# Patient Record
Sex: Female | Born: 1970 | Race: Black or African American | Hispanic: No | Marital: Single | Smoking: Light tobacco smoker
Health system: Southern US, Community
[De-identification: ages and names within clinical notes are randomized; demographics above are authoritative.]

## PROBLEM LIST (undated history)

## (undated) DIAGNOSIS — N200 Calculus of kidney: Secondary | ICD-10-CM

## (undated) DIAGNOSIS — N12 Tubulo-interstitial nephritis, not specified as acute or chronic: Secondary | ICD-10-CM

---

## 1998-02-01 ENCOUNTER — Inpatient Hospital Stay (HOSPITAL_COMMUNITY): Admission: AD | Admit: 1998-02-01 | Discharge: 1998-02-02 | Payer: Self-pay | Admitting: Obstetrics & Gynecology

## 1998-08-19 ENCOUNTER — Inpatient Hospital Stay (HOSPITAL_COMMUNITY): Admission: EM | Admit: 1998-08-19 | Discharge: 1998-08-19 | Payer: Self-pay | Admitting: *Deleted

## 1998-08-20 ENCOUNTER — Inpatient Hospital Stay (HOSPITAL_COMMUNITY): Admission: AD | Admit: 1998-08-20 | Discharge: 1998-08-20 | Payer: Self-pay | Admitting: *Deleted

## 1998-09-11 ENCOUNTER — Inpatient Hospital Stay (HOSPITAL_COMMUNITY): Admission: AD | Admit: 1998-09-11 | Discharge: 1998-09-11 | Payer: Self-pay | Admitting: *Deleted

## 1998-11-01 ENCOUNTER — Encounter: Payer: Self-pay | Admitting: Emergency Medicine

## 1998-11-01 ENCOUNTER — Emergency Department (HOSPITAL_COMMUNITY): Admission: EM | Admit: 1998-11-01 | Discharge: 1998-11-01 | Payer: Self-pay | Admitting: Emergency Medicine

## 1998-12-08 ENCOUNTER — Emergency Department (HOSPITAL_COMMUNITY): Admission: EM | Admit: 1998-12-08 | Discharge: 1998-12-08 | Payer: Self-pay | Admitting: Emergency Medicine

## 2000-04-27 ENCOUNTER — Inpatient Hospital Stay (HOSPITAL_COMMUNITY): Admission: AD | Admit: 2000-04-27 | Discharge: 2000-05-01 | Payer: Self-pay | Admitting: *Deleted

## 2000-04-27 ENCOUNTER — Encounter: Payer: Self-pay | Admitting: *Deleted

## 2000-11-20 ENCOUNTER — Emergency Department (HOSPITAL_COMMUNITY): Admission: EM | Admit: 2000-11-20 | Discharge: 2000-11-21 | Payer: Self-pay | Admitting: Emergency Medicine

## 2000-11-28 ENCOUNTER — Inpatient Hospital Stay (HOSPITAL_COMMUNITY): Admission: EM | Admit: 2000-11-28 | Discharge: 2000-11-30 | Payer: Self-pay | Admitting: *Deleted

## 2012-11-24 ENCOUNTER — Observation Stay (HOSPITAL_COMMUNITY): Payer: Medicaid Other

## 2012-11-24 ENCOUNTER — Observation Stay (HOSPITAL_COMMUNITY)
Admission: EM | Admit: 2012-11-24 | Discharge: 2012-11-25 | Disposition: A | Discharge status: Home / Self Care | Payer: Medicaid Other | Attending: Internal Medicine | Admitting: Internal Medicine

## 2012-11-24 ENCOUNTER — Encounter (HOSPITAL_COMMUNITY): Payer: Self-pay | Admitting: Emergency Medicine

## 2012-11-24 DIAGNOSIS — I959 Hypotension, unspecified: Secondary | ICD-10-CM | POA: Insufficient documentation

## 2012-11-24 DIAGNOSIS — N10 Acute tubulo-interstitial nephritis: Principal | ICD-10-CM | POA: Insufficient documentation

## 2012-11-24 DIAGNOSIS — N12 Tubulo-interstitial nephritis, not specified as acute or chronic: Secondary | ICD-10-CM

## 2012-11-24 HISTORY — DX: Tubulo-interstitial nephritis, not specified as acute or chronic: N12

## 2012-11-24 LAB — BASIC METABOLIC PANEL
BUN: 10 mg/dL (ref 6–23)
CO2: 24 mEq/L (ref 19–32)
Chloride: 103 mEq/L (ref 96–112)
GFR calc non Af Amer: 67 mL/min — ABNORMAL LOW (ref >90)
Glucose, Bld: 97 mg/dL (ref 70–99)
Potassium: 3.7 mEq/L (ref 3.5–5.1)
Sodium: 136 mEq/L (ref 135–145)

## 2012-11-24 LAB — CBC WITH DIFFERENTIAL/PLATELET
Eosinophils Absolute: 0.1 10*3/uL (ref 0.0–0.7)
HCT: 34 % — ABNORMAL LOW (ref 36.0–46.0)
Lymphocytes Relative: 34 % (ref 12–46)
Lymphs Abs: 2.5 10*3/uL (ref 0.7–4.0)
MCHC: 35.9 g/dL (ref 30.0–36.0)
MCV: 81.3 fL (ref 78.0–100.0)
Monocytes Relative: 10 % (ref 3–12)
Neutro Abs: 4.2 10*3/uL (ref 1.7–7.7)
RDW: 13.5 % (ref 11.5–15.5)

## 2012-11-24 LAB — CG4 I-STAT (LACTIC ACID): Lactic Acid, Venous: 0.58 mmol/L (ref 0.5–2.2)

## 2012-11-24 LAB — URINE MICROSCOPIC-ADD ON

## 2012-11-24 LAB — HEPATIC FUNCTION PANEL
AST: 19 U/L (ref 0–37)
Albumin: 3.3 g/dL — ABNORMAL LOW (ref 3.5–5.2)
Alkaline Phosphatase: 54 U/L (ref 39–117)
Total Bilirubin: 0.5 mg/dL (ref 0.3–1.2)
Total Protein: 7.2 g/dL (ref 6.0–8.3)

## 2012-11-24 LAB — URINALYSIS, ROUTINE W REFLEX MICROSCOPIC
Glucose, UA: NEGATIVE mg/dL
Ketones, ur: NEGATIVE mg/dL
Protein, ur: 30 mg/dL — AB

## 2012-11-24 MED ORDER — PROMETHAZINE HCL 25 MG PO TABS: 25.0000 mg | ORAL_TABLET | Freq: Four times a day (QID) | ORAL | Status: DC | PRN

## 2012-11-24 MED ORDER — ONDANSETRON HCL 4 MG/2ML IJ SOLN: 4.0000 mg | Freq: Four times a day (QID) | INTRAMUSCULAR | Status: DC | PRN

## 2012-11-24 MED ORDER — HYDROCODONE-ACETAMINOPHEN 5-325 MG PO TABS: ORAL_TABLET | ORAL | Status: DC

## 2012-11-24 MED ORDER — DEXTROSE 5 % IV SOLN
1.0000 g | Freq: Once | INTRAVENOUS | Status: AC
  Administered 2012-11-24: 1 g via INTRAVENOUS
  Filled 2012-11-24: qty 10

## 2012-11-24 MED ORDER — KETOROLAC TROMETHAMINE 30 MG/ML IJ SOLN
15.0000 mg | Freq: Once | INTRAMUSCULAR | Status: AC
  Administered 2012-11-24: 15 mg via INTRAVENOUS
  Filled 2012-11-24: qty 1

## 2012-11-24 MED ORDER — HYDROCODONE-ACETAMINOPHEN 5-325 MG PO TABS
1.0000 | ORAL_TABLET | ORAL | Status: DC | PRN
  Administered 2012-11-24 – 2012-11-25 (×6): 2 via ORAL
  Filled 2012-11-24 (×6): qty 2

## 2012-11-24 MED ORDER — ENOXAPARIN SODIUM 40 MG/0.4ML ~~LOC~~ SOLN
40.0000 mg | SUBCUTANEOUS | Status: DC
  Administered 2012-11-24: 40 mg via SUBCUTANEOUS
  Filled 2012-11-24 (×2): qty 0.4

## 2012-11-24 MED ORDER — SODIUM CHLORIDE 0.9 % IV SOLN
INTRAVENOUS | Status: DC
  Administered 2012-11-24 – 2012-11-25 (×2): via INTRAVENOUS

## 2012-11-24 MED ORDER — DEXTROSE 5 % IV SOLN
1.0000 g | INTRAVENOUS | Status: DC
  Administered 2012-11-25: 1 g via INTRAVENOUS
  Filled 2012-11-24: qty 10

## 2012-11-24 MED ORDER — ONDANSETRON HCL 4 MG PO TABS
4.0000 mg | ORAL_TABLET | Freq: Four times a day (QID) | ORAL | Status: DC | PRN
  Administered 2012-11-25: 4 mg via ORAL
  Filled 2012-11-24: qty 1

## 2012-11-24 MED ORDER — MORPHINE SULFATE 4 MG/ML IJ SOLN
4.0000 mg | Freq: Once | INTRAMUSCULAR | Status: AC
  Administered 2012-11-24: 4 mg via INTRAVENOUS
  Filled 2012-11-24: qty 1

## 2012-11-24 MED ORDER — SODIUM CHLORIDE 0.9 % IV BOLUS (SEPSIS)
1000.0000 mL | Freq: Once | INTRAVENOUS | Status: AC
  Administered 2012-11-24: 1000 mL via INTRAVENOUS

## 2012-11-24 MED ORDER — SODIUM CHLORIDE 0.9 % IJ SOLN: 3.0000 mL | Freq: Two times a day (BID) | INTRAMUSCULAR | Status: DC

## 2012-11-24 MED ORDER — ACETAMINOPHEN 325 MG PO TABS: 650.0000 mg | ORAL_TABLET | Freq: Four times a day (QID) | ORAL | Status: DC | PRN

## 2012-11-24 MED ORDER — ONDANSETRON HCL 4 MG/2ML IJ SOLN
4.0000 mg | Freq: Once | INTRAMUSCULAR | Status: AC
  Administered 2012-11-24: 4 mg via INTRAVENOUS
  Filled 2012-11-24: qty 2

## 2012-11-24 MED ORDER — ACETAMINOPHEN 650 MG RE SUPP: 650.0000 mg | Freq: Four times a day (QID) | RECTAL | Status: DC | PRN

## 2012-11-24 MED ORDER — CEPHALEXIN 500 MG PO CAPS: 500.0000 mg | ORAL_CAPSULE | Freq: Three times a day (TID) | ORAL | Status: DC

## 2012-11-24 NOTE — ED Notes (Signed)
MD at bedside. 

## 2012-11-24 NOTE — ED Provider Notes (Signed)
I was notified by nursing staff that just prior to discharge patient had an episode of hypotension. Her systolic pressures were measured in the 80s. IV fluid bolus was given. Lactate ordered and pending.  I assessed the patient she was awake, alert and oriented with no new complaints.   11:04 AM Pt reassessed. Still mild hypotension despite IVF bolus x 3.  Lactate looks ok.  Will plan for admission for persistent hypotension in setting of pyelonephritis.  Will order blood cultures.  Ashby Dawes, MD 11/24/12 [phone]

## 2012-11-24 NOTE — ED Notes (Signed)
Admission MD at bedside.  

## 2012-11-24 NOTE — Progress Notes (Signed)
MD on call notified that pt had a run of Junctional rhythm but pt had converted to NSR MD was on on the floor and and strip was shown. Pt was asymptomatic  No new orders. Ilean Skill LPN

## 2012-11-24 NOTE — ED Notes (Signed)
Report given to Diane, RN.

## 2012-11-24 NOTE — Care Management Note (Signed)
    Page 1 of 1   11/25/2012     12:45:15 PM   CARE MANAGEMENT NOTE 11/25/2012  Patient:  Melody Henderson, Melody Henderson   Account Number:  [id number]  Date Initiated:  11/24/2012  Documentation initiated by:  Letha Cape  Subjective/Objective Assessment:   dx pyelo  admit- lives with spouse, just moved from [place], will need to apply for medicaid of [place]. pta indep.     Action/Plan:   Anticipated DC Date:  11/25/2012   Anticipated DC Plan:  HOME/SELF CARE      DC Planning Services  CM consult      Choice offered to / List presented to:             Status of service:  Completed, signed off Medicare Important Message given?   (If response is "NO", the following Medicare IM given date fields will be blank) Date Medicare IM given:   Date Additional Medicare IM given:    Discharge Disposition:  HOME/SELF CARE  Per UR Regulation:  Reviewed for med. necessity/level of care/duration of stay  If discussed at Long Length of Stay Meetings, dates discussed:    Comments:  11/25/12 12:43 Letha Cape RN, BSN [phone] patient is for dc today, MD will give patient meds off the $4 list at [facility], so she will not need medication ast. No needs anticipated. Patient will f/u with internal medicine clinic.  11/24/12 15:41 Letha Cape RN, BSN [phone] patient lives with spouse, she just moved to [place] from [place] she had medicaid there but will now need to apply for Medicaid of [place].  Patient will need a PCP, but will need to have been in [place] for 3 months  to get appt at [facility].  Spoke with Dr. Darci Needle and she states she will have patient follow up with them in the Internal Medicine Clinic and they will try to use $4 med list for patient .  Also the Financial counselor will be contacting patient in the room.

## 2012-11-24 NOTE — Progress Notes (Signed)
Melody Henderson [id number] Admission Data: 11/24/2012 4:58 PM Attending Provider: Rocco Serene, MD  PCP:No PCP Per Patient Consults/ Treatment Team:    Melody Henderson is a 42 y.o. female patient admitted from ED awake, alert  & orientated  X 3,  Full Code, VSS - Blood pressure 95/60, pulse 82, temperature 98.3 F (36.8 C), temperature source Oral, resp. rate 20, height 5\' 6"  (1.676 m), weight 73.573 kg (162 lb 3.2 oz), last menstrual period 11/21/2012, SpO2 98.00%., , no c/o shortness of breath, no c/o chest pain, no distress noted. Tele # [id number] placed and pt is currently running:normal sinus rhythm.   IV site WDL:  antecubital right, condition patent and no redness with a transparent dsg that's clean dry and intact.  Allergies:  No Known Allergies   Past Medical History  Diagnosis Date  . Pyelonephritis 11/24/2012    History:  obtained from the patient.   Pt orientation to unit, room and routine. Information packet given to patient/family and safety video watched.  Admission INP armband ID verified with patient/family, and in place. SR up x 2, fall risk assessment complete with Patient and family verbalizing understanding of risks associated with falls. Pt verbalizes an understanding of how to use the call bell and to call for help before getting out of bed.  Skin, clean-dry- intact without evidence of bruising, or skin tears.   No evidence of skin break down noted on exam.     Will cont to monitor and assist as needed.  Cindra Eves, RN 11/24/2012 4:58 PM

## 2012-11-24 NOTE — ED Notes (Signed)
Lactic acid results shown to Dr. Dan Humphreys

## 2012-11-24 NOTE — ED Notes (Signed)
MD Dan Humphreys made aware of pt's BP.

## 2012-11-24 NOTE — ED Notes (Signed)
Per pt, pt has been experiencing generalized pain since Monday. Pt states it has gotten worse since then. When asked to be more specific about her pain, pt stated that her kidneys hurt and sometimes it felt like it was hard to breathe. Pt also stated having chills and nausea. Denies Emesis.

## 2012-11-24 NOTE — ED Notes (Signed)
Reported low bp to berkely/rn.

## 2012-11-24 NOTE — ED Provider Notes (Signed)
History    CSN: [id number] Arrival date & time 11/24/12  0603  First MD Initiated Contact with Patient 11/24/12 [phone]     Chief Complaint  Patient presents with  . Fatigue  . Chills  . Pain   (Consider location/radiation/quality/duration/timing/severity/associated sxs/prior Treatment) HPI  Melody Henderson is a 42 y.o. female complaining of subjective fever, chills, flank pain, nausea urinary frequency worsening over the course of the last 4 days. Patient denies dysuria, emesis, cough, shortness of breath, headache, cervicalgia, rash, change in bowel habits, sick contacts, abnormal vaginal discharge. She rates her pain at 9/10, she is taken Motrin at home with little relief, she cannot identify any alleviating factors, she states that taking a deep breath makes the pain worse.  History reviewed. No pertinent past medical history. History reviewed. No pertinent past surgical history. No family history on file. History  Substance Use Topics  . Smoking status: Not on file  . Smokeless tobacco: Not on file  . Alcohol Use: Not on file   OB History   Grav Para Term Preterm Abortions TAB SAB Ect Mult Living                 Review of Systems  Constitutional:       Negative except as described in HPI  HENT:       Negative except as described in HPI  Respiratory:       Negative except as described in HPI  Cardiovascular:       Negative except as described in HPI  Gastrointestinal:       Negative except as described in HPI  Genitourinary:       Negative except as described in HPI  Musculoskeletal:       Negative except as described in HPI  Skin:       Negative except as described in HPI  Neurological:       Negative except as described in HPI  All other systems reviewed and are negative.    Allergies  Review of patient's allergies indicates not on file.  Home Medications  No current outpatient prescriptions on file. LMP 11/21/2012 Physical Exam  Nursing note and  vitals reviewed. Constitutional: She is oriented to person, place, and time. She appears well-developed and well-nourished. No distress.  Appears mildly uncomfortable  HENT:  Head: Normocephalic and atraumatic.  Mouth/Throat: Oropharynx is clear and moist.  Eyes: Conjunctivae and EOM are normal.  Neck: Normal range of motion. Neck supple.  No midline tenderness to palpation or step-offs appreciated. Patient has full range of motion without pain.   Cardiovascular: Normal rate.   Pulmonary/Chest: Effort normal and breath sounds normal. No stridor. No respiratory distress. She has no wheezes. She has no rales. She exhibits no tenderness.  Abdominal: Soft. Bowel sounds are normal. She exhibits no distension and no mass. There is no tenderness. There is no rebound and no guarding.  Genitourinary:  Mild to moderate bilateral CVA tenderness  Musculoskeletal: Normal range of motion. She exhibits no edema.  No calf asymmetry, superficial collaterals, palpable cords, edema, Homans sign negative bilaterally.    Neurological: She is alert and oriented to person, place, and time.  Psychiatric: She has a normal mood and affect.    ED Course  Procedures (including critical care time) Labs Reviewed  CBC WITH DIFFERENTIAL - Abnormal; Notable for the following:    HCT 34.0 (*)    All other components within normal limits  BASIC METABOLIC PANEL - Abnormal; Notable for  the following:    GFR calc non Af Amer 67 (*)    GFR calc Af Amer 77 (*)    All other components within normal limits  URINALYSIS, ROUTINE W REFLEX MICROSCOPIC  POCT PREGNANCY, URINE   No results found.  8:47 AM patient states her pain is improved however she does still have a little pain in the back. I will put her in for Toradol in addition to morphine.  1. Pyelonephritis   2. Acute pyelonephritis     MDM   Filed Vitals:   11/24/12 0632 11/24/12 0810  BP: 98/58 104/66  Pulse: 80 74  Temp: 98.7 F (37.1 C)   TempSrc:  Oral   Resp: 18 18  SpO2: 98% 98%     Melody Henderson is a 42 y.o. female with history of present illness consistent with pyelonephritis, patient also has bilateral CVA tenderness. Patient has no leukocytosis. UA c/w UTI/Pyelo  Medications  sodium chloride 0.9 % bolus 1,000 mL (1,000 mLs Intravenous New Bag/Given 11/24/12 4098)  morphine 4 MG/ML injection 4 mg (4 mg Intravenous Given 11/24/12 0711)  ondansetron (ZOFRAN) injection 4 mg (4 mg Intravenous Given 11/24/12 0711)    Pt is hemodynamically stable, appropriate for, and amenable to discharge at this time. Pt verbalized understanding and agrees with care plan. All questions answered. Outpatient follow-up and specific return precautions discussed.    Discharge Medication List as of 11/25/2012  2:37 PM    START taking these medications   Details  ciprofloxacin (CIPRO) 500 MG tablet Take 1 tablet (500 mg total) by mouth 2 (two) times daily., Starting 11/25/2012, Until Discontinued, Print    HYDROcodone-acetaminophen (NORCO/VICODIN) 5-325 MG per tablet Take 1-2 tablets by mouth every 6 hours as needed for pain., Print    promethazine (PHENERGAN) 25 MG tablet Take 1 tablet (25 mg total) by mouth every 6 (six) hours as needed for nausea., Starting 11/24/2012, Until Discontinued, Print        Note: Portions of this report may have been transcribed using voice recognition software. Every effort was made to ensure accuracy; however, inadvertent computerized transcription errors may be present    Wynetta Emery, PA-C 11/26/12 [phone]

## 2012-11-24 NOTE — H&P (Signed)
Date: 11/24/2012               Patient Name:  Melody Henderson MRN: [id number]  DOB: Sep 14, 1970 Age / Sex: 42 y.o., female   PCP: No Pcp Per Patient         Medical Service: Internal Medicine Teaching Service         Attending Physician: Dr. Rocco Serene, MD    First Contact: Dr. Darci Needle Pager: [phone]  Second Contact: Dr. Manson Passey Pager: [phone]       After Hours (After 5p/  First Contact Pager: [phone]  weekends / holidays): Second Contact Pager: [phone]   Chief Complaint: flank pain, dysuria  History of Present Illness:  The patient is a 41 YO woman with no significant PMH who is presenting with flank pain, dysuria, nausea. She states that last Friday she started having some mild dysuria which progressed into "kidney" pain which is located in her lower back and radiates into her lower abdomen. She states that she was taking some motrin (ibuprofen) over the counter which did not help much. She has also been feeling nauseous for about 4 days and has decreased the amount she is drinking and eating but she denied vomiting, constipation, diarrhea. She states that she has a headache but this is not unusual for her. She was having fevers and chills for a day or two and was feeling like she would "fall out" the day prior to admission. She denies falling or passing out. She states that she has had this problem before when she was living in [place]. She moved last week with her husband. She has 9 children ranging in age from 47-10 and not all of them are still living with her. She does not admit to using alcohol or tobacco or illicit substances. She is monogamous with her husband and has not had any new sexual partners recently. She denies any vaginal discharge. She does not have a doctor in the area.   Meds: OTC Motrin  Allergies: Allergies as of 11/24/2012  . (No Known Allergies)   History reviewed. No pertinent past medical history. History reviewed. No pertinent past surgical  history. History reviewed. No pertinent family history. History   Social History  . Marital Status: Single    Spouse Name: N/A    Number of Children: N/A  . Years of Education: N/A   Occupational History  . Not on file.   Social History Main Topics  . Smoking status: Never Smoker   . Smokeless tobacco: Never Used  . Alcohol Use: No  . Drug Use: No  . Sexually Active: Not on file     Comment: Married with 9 children ranging from 22-10   Other Topics Concern  . Not on file   Social History Narrative  . No narrative on file    Review of Systems: A comprehensive 10 point review of systems was performed and other than pertinent positives and negatives listed above in HPI was negative.   Physical Exam: Blood pressure 102/63, pulse 53, temperature 98.7 F (37.1 C), temperature source Oral, resp. rate 16, last menstrual period 11/21/2012, SpO2 99.00%. General: resting in bed, lying in the dark, awake, pleasant HEENT: PERRL, EOMI, no scleral icterus Cardiac: RRR, no rubs, murmurs or gallops Pulm: clear to auscultation bilaterally, moving normal volumes of air, some voluntary decreased effort Abd: soft, tender in suprapubic area, right greater than left flank pain, voluntary guarding without rebound tenderness, nondistended, BS present Ext: warm and well perfused,  no pedal edema Neuro: alert and oriented X3, cranial nerves II-XII grossly intact   Lab results: Basic Metabolic Panel:  Recent Labs  78/29/56 0700  NA 136  K 3.7  CL 103  CO2 24  GLUCOSE 97  BUN 10  CREATININE 1.02  CALCIUM 8.8   Liver Function Tests:  Recent Labs  11/24/12 0700  AST 19  ALT 7  ALKPHOS 54  BILITOT 0.5  PROT 7.2  ALBUMIN 3.3*   CBC:  Recent Labs  11/24/12 0700  WBC 7.6  NEUTROABS 4.2  HGB 12.2  HCT 34.0*  MCV 81.3  PLT 219   Urinalysis:  Recent Labs  11/24/12 0720  COLORURINE YELLOW  LABSPEC 1.014  PHURINE 6.0  GLUCOSEU NEGATIVE  HGBUR LARGE*  BILIRUBINUR  NEGATIVE  KETONESUR NEGATIVE  PROTEINUR 30*  UROBILINOGEN 1.0  NITRITE POSITIVE*  LEUKOCYTESUR LARGE*   Assessment & Plan by Problem: Acute pyelonephritis - The patient has classic symptoms and presentation for pyelopnephritis. No known new sexual risk factors but will screen for HIV, chlamydia, gonorrhea. Will order renal ultrasound to look for renal pyelonephritis or hydronephrosis. Cr is 1.02 (GFR <90) which may be elevated from baseline. Other differential include appendicitis, gallbladder disease however are not typical for location or description. Will continue to monitor closely for improvement with treatment.   -Ceftriaxone 1 g q 24 hours -Blood cultures times 2  (although drawn after antibiotics in ED) -NS @ 150 cc/hr overnight -F/U urine culture results and GC/Chlamydia  -F/U BMP in AM  DVT ppx - lovenox [place] daily  Dispo: Disposition is deferred at this time, awaiting improvement of current medical problems. Anticipated discharge in approximately 1-2 day(s).   The patient does not have a current PCP (No Pcp Per Patient) and does need an [facility] hospital follow-up appointment after discharge.  The patient does not have transportation limitations that hinder transportation to clinic appointments.  Signed: Judie Bonus, MD 11/24/2012, 1:50 PM

## 2012-11-24 NOTE — ED Notes (Signed)
MD Dan Humphreys made aware of pt's BP. Fluids opened up.

## 2012-11-25 DIAGNOSIS — N12 Tubulo-interstitial nephritis, not specified as acute or chronic: Secondary | ICD-10-CM

## 2012-11-25 LAB — BASIC METABOLIC PANEL
Chloride: 105 mEq/L (ref 96–112)
GFR calc Af Amer: 78 mL/min — ABNORMAL LOW (ref >90)
Potassium: 3.9 mEq/L (ref 3.5–5.1)

## 2012-11-25 LAB — CBC
HCT: 30.4 % — ABNORMAL LOW (ref 36.0–46.0)
Hemoglobin: 11.1 g/dL — ABNORMAL LOW (ref 12.0–15.0)
RDW: 13.5 % (ref 11.5–15.5)
WBC: 6 10*3/uL (ref 4.0–10.5)

## 2012-11-25 LAB — TROPONIN I: Troponin I: <0.3 ng/mL (ref <0.30)

## 2012-11-25 MED ORDER — HYDROCODONE-ACETAMINOPHEN 5-325 MG PO TABS: ORAL_TABLET | ORAL | Status: DC

## 2012-11-25 MED ORDER — PROMETHAZINE HCL 25 MG PO TABS: 25.0000 mg | ORAL_TABLET | Freq: Four times a day (QID) | ORAL | Status: DC | PRN

## 2012-11-25 MED ORDER — CIPROFLOXACIN HCL 500 MG PO TABS: 500.0000 mg | ORAL_TABLET | Freq: Two times a day (BID) | ORAL | Status: DC

## 2012-11-25 NOTE — Discharge Summary (Signed)
Name: Melody Henderson MRN: [id number] DOB: [birth year] 42 y.o. PCP: Melody Hummingbird, MD  Date of Admission: 11/24/2012  6:03 AM Date of Discharge: 11/25/2012 Attending Physician: Dr. Josem Henderson  Discharge Diagnosis: Principal Problem:   Acute pyelonephritis  Discharge Medications:   Medication List         ciprofloxacin 500 MG tablet  Commonly known as:  CIPRO  Take 1 tablet (500 mg total) by mouth 2 (two) times daily.     HYDROcodone-acetaminophen 5-325 MG per tablet  Commonly known as:  NORCO/VICODIN  Take 1-2 tablets by mouth every 6 hours as needed for pain.     promethazine 25 MG tablet  Commonly known as:  PHENERGAN  Take 1 tablet (25 mg total) by mouth every 6 (six) hours as needed for nausea.        Disposition and follow-up:   [name] was discharged from [facility] in Stable condition.  At the hospital follow up visit please address:  1.  Acute pyelonephritis- assess for symptomatic resolution  2.  Labs / imaging needed at time of follow-up: none 3.  Pending labs/ test needing follow-up: HIV ab, GC/ Chlamydia, UCx pending at time of discharge  Follow-up Appointments:     Follow-up Information   Follow up with Melody Hummingbird, MD On 01/16/2013. (PCP appointment at 2pm)    Contact information:   [address] [facility] [place] [address] [phone]       Follow up with Melody Koch, MD On 12/01/2012. (3:30pm)    Contact information:   [address] [place] [facility] [place] [address] [phone]       Discharge Instructions: Discharge Orders   Future Appointments Provider Department Dept Phone   12/01/2012 3:30 PM Melody Koch, MD [place] [facility] [phone]   01/16/2013 2:00 PM Melody Hummingbird, MD [facility] [phone]   Future Orders Complete By Expires     Call MD for:  persistant  nausea and vomiting  As directed     Call MD for:  severe uncontrolled pain  As directed     Call MD for:  temperature >100.4  As directed     Diet - low sodium heart healthy  As directed     Increase activity slowly  As directed        Consultations:  none  Procedures Performed:  US Renal  11/25/2012   *RADIOLOGY REPORT*  Clinical Data: Fatigue, chills, pain.  Acute pyelonephritis.  RENAL/URINARY TRACT ULTRASOUND COMPLETE  Comparison:  None.  Findings:  Right Kidney:  The right kidney measures 12.6 cm length.  Normal parenchymal echotexture and thickness.  No hydronephrosis.  No solid mass.  No perinephric fluid collections.  Left Kidney:  The left kidney measures 10.6 cm length.  Normal parenchymal echotexture and thickness.  No hydronephrosis.  No solid mass.  No perinephric fluid collections.  Bladder:  The bladder is incompletely distended and is therefore incompletely evaluated but there is no specific evidence of bladder wall thickening or filling defect.  Incidental note of moderately enlarged heterogeneous appearing uterus measuring 9.7 x 7.2-7.9 cm.  Small amount of fluid in the endometrium with otherwise normal endometrial stripe thickness.  IMPRESSION: Normal ultrasound appearance of the kidneys and bladder.  The uterus is incidentally noted to be mildly enlarged and heterogeneous appearing with small amount of fluid in the normal thickness endometrium.   Original Report Authenticated By: Burman Nieves, M.D.  Admission HPI:  The patient is a 42 YO woman with no significant PMH who is presenting with flank pain, dysuria, nausea. She states that last Friday she started having some mild dysuria which progressed into "kidney" pain which is located in her lower back and radiates into her lower abdomen. She states that she was taking some motrin (ibuprofen) over the counter which did not help much. She has also been feeling nauseous for about 4 days and has decreased the amount she is  drinking and eating but she denied vomiting, constipation, diarrhea. She states that she has a headache but this is not unusual for her. She was having fevers and chills for a day or two and was feeling like she would "fall out" the day prior to admission. She denies falling or passing out. She states that she has had this problem before when she was living in [place]. She moved last week with her husband. She has 9 children ranging in age from 41-10 and not all of them are still living with her. She does not admit to using alcohol or tobacco or illicit substances. She is monogamous with her husband and has not had any new sexual partners recently. She denies any vaginal discharge. She does not have a doctor in the area.   Hospital Course by problem list:   1. Acute Pyelonephritis- Patient presented with R flank pain, dysuria, hematuria, nausea, R CVA tenderness as well as a dirty UA c/w acute pyelonephritis. Patient was afebrile without a leukocytosis and did not meet SIRS criteria. However, her BP dropped as low as 80s/60s in the ED just prior to discharge, so ED thought it prudent to evaluate her overnight. Patient was not orthostatic nor did she have symptoms of hypotension such as lightheadedness or presyncope; however, we did monitor her on telemetry. Patient received IL NS and ceftriaxone x 1 in ED. She was managed on NS 125cc/hr, norco for pain management, zofran for nausea during her admission. We obtained a renal US, which showed no renal abnormalities. Of note, patient had episode of sharp, central chest pain that lasted 10 minutes and resolved on its own without intervention. EKG and troponin x 1, both unremarkable. Telemetry showed no acute events during this episode of chest pain. No further work up was done. During her stay she received one more dose of ceftriaxone (total two doses) and discharged her on cipro 500mg  bid x 6 days. Patient remained afebrile and was feeling much improved on morning  of discharge, though she was still having some R flank pain, but the nausea was improved. Patient was discharged with phenergan and percocet for symptom management. UCX, HIV ab, GC and chlamydia pending at time of discharge.  Discharge Vitals:   BP 104/71  Pulse 67  Temp(Src) 99.3 F (37.4 C) (Oral)  Resp 18  Ht 5\' 6"  (1.676 m)  Wt 162 lb 3.2 oz (73.573 kg)  BMI 26.19 kg/m2  SpO2 100%  LMP 11/21/2012  Discharge Labs:  Results for orders placed during the hospital encounter of 11/24/12 (from the past 24 hour(s))  BASIC METABOLIC PANEL     Status: Abnormal   Collection Time    11/25/12  7:02 AM      Result Value Range   Sodium 136  135 - 145 mEq/L   Potassium 3.9  3.5 - 5.1 mEq/L   Chloride 105  96 - 112 mEq/L   CO2 23  19 - 32 mEq/L   Glucose, Bld 95  70 -  99 mg/dL   BUN 8  6 - 23 mg/dL   Creatinine, Ser 4.69  0.50 - 1.10 mg/dL   Calcium 8.3 (*) 8.4 - 10.5 mg/dL   GFR calc non Af Amer 68 (*) >90 mL/min   GFR calc Af Amer 78 (*) >90 mL/min  CBC     Status: Abnormal   Collection Time    11/25/12  7:02 AM      Result Value Range   WBC 6.0  4.0 - 10.5 K/uL   RBC 3.72 (*) 3.87 - 5.11 MIL/uL   Hemoglobin 11.1 (*) 12.0 - 15.0 g/dL   HCT 62.9 (*) 52.8 - 41.3 %   MCV 81.7  78.0 - 100.0 fL   MCH 29.8  26.0 - 34.0 pg   MCHC 36.5 (*) 30.0 - 36.0 g/dL   RDW 24.4  01.0 - 27.2 %   Platelets 206  150 - 400 K/uL  HIV ANTIBODY (ROUTINE TESTING)     Status: None   Collection Time    11/25/12  7:02 AM      Result Value Range   HIV NON REACTIVE  NON REACTIVE  TROPONIN I     Status: None   Collection Time    11/25/12  7:02 AM      Result Value Range   Troponin I <0.30  <0.30 ng/mL    Signed: Windell Hummingbird, MD 11/25/2012, 3:15 PM   Time Spent on Discharge: 45 minutes Services Ordered on Discharge: none Equipment Ordered on Discharge: none

## 2012-11-25 NOTE — Progress Notes (Signed)
Pt c/o 6/10 chest pain mid sternal, nausea, and shortness of breath.  MD Archer Asa notified, EKG done, VSS, pt placed on 2L [place] for comfort, Norco and Zofran given, pt states relief of pain. One time troponin ordered, will notify MD of further complaints. [place], Hospital doctor E

## 2012-11-25 NOTE — Progress Notes (Signed)
Subjective: Patient feels much better this morning. She has some continued R flank pain, but her nausea has improved and she has not vomited. Last night patient was awoken from sleep by a sharp, stabbing central chest pain and diaphoresis. Patient had EKG that was normal and troponin that was negative. This episode of CP lasted 10 minutes and resolved on its own with no intervention. She as no CP and has had none since this one episode. No SOB. Telemetry this morning showed now acute events overnight. Blood pressures have continued to be low, but this is likely patient's baseline. She is not orthostatic.  Objective: Vital signs in last 24 hours: Filed Vitals:   11/24/12 1455 11/24/12 2118 11/25/12 0501 11/25/12 1416  BP: 95/60 97/55 102/68 104/71  Pulse: 82 74 68 67  Temp:  99.6 F (37.6 C)  99.3 F (37.4 C)  TempSrc:  Oral  Oral  Resp: 20 20  18   Height:      Weight:      SpO2:  98% 100% 100%    Intake/Output Summary (Last 24 hours) at 11/25/12 1513 Last data filed at 11/25/12 0900  Gross per 24 hour  Intake 2108.33 ml  Output      0 ml  Net 2108.33 ml   Physical exam:  General: alert, cooperative, and in no apparent distress HEENT: pupils equal round and reactive to light, vision grossly intact, oropharynx clear and non-erythematous, moist mucosal membranes  Neck: supple, no lymphadenopathy Lungs: clear to ascultation bilaterally, normal work of respiration, no wheezes, rales, ronchi Heart: regular rate and rhythm, no murmurs, gallops, or rubs Abdomen: soft, suprapubic and R flank TTP, CVA tenderness R>L, no guarding or rebound tenderness, normal bowel sounds Extremities: warm extremities, no BLE edema Neurologic: alert & oriented X3, cranial nerves II-XII grossly intact, strength grossly intact, sensation intact to light touch  Lab Results: Basic Metabolic Panel:  Recent Labs Lab 11/24/12 0700 11/25/12 0702  NA 136 136  K 3.7 3.9  CL 103 105  CO2 24 23  GLUCOSE  97 95  BUN 10 8  CREATININE 1.02 1.01  CALCIUM 8.8 8.3*   Liver Function Tests:  Recent Labs Lab 11/24/12 0700  AST 19  ALT 7  ALKPHOS 54  BILITOT 0.5  PROT 7.2  ALBUMIN 3.3*   CBC:  Recent Labs Lab 11/24/12 0700 11/25/12 0702  WBC 7.6 6.0  NEUTROABS 4.2  --   HGB 12.2 11.1*  HCT 34.0* 30.4*  MCV 81.3 81.7  PLT 219 206   Cardiac Enzymes:  Recent Labs Lab 11/25/12 0702  TROPONINI <0.30   Urinalysis:  Recent Labs Lab 11/24/12 0720  COLORURINE YELLOW  LABSPEC 1.014  PHURINE 6.0  GLUCOSEU NEGATIVE  HGBUR LARGE*  BILIRUBINUR NEGATIVE  KETONESUR NEGATIVE  PROTEINUR 30*  UROBILINOGEN 1.0  NITRITE POSITIVE*  LEUKOCYTESUR LARGE*   Micro Results: Recent Results (from the past 240 hour(s))  URINE CULTURE     Status: None   Collection Time    11/24/12  7:20 AM      Result Value Range Status   Specimen Description URINE, CATHETERIZED   Final   Special Requests NONE   Final   Culture  Setup Time 11/24/2012 09:07   Final   Colony Count >=100,000 COLONIES/ML   Final   Culture ESCHERICHIA COLI   Final   Report Status PENDING   Incomplete  CULTURE, BLOOD (ROUTINE X 2)     Status: None   Collection Time  11/24/12 11:55 AM      Result Value Range Status   Specimen Description BLOOD ARM LEFT   Final   Special Requests BOTTLES DRAWN AEROBIC AND ANAEROBIC 10CC   Final   Culture  Setup Time 11/24/2012 16:50   Final   Culture     Final   Value:        BLOOD CULTURE RECEIVED NO GROWTH TO DATE CULTURE WILL BE HELD FOR 5 DAYS BEFORE ISSUING A FINAL NEGATIVE REPORT   Report Status PENDING   Incomplete  CULTURE, BLOOD (ROUTINE X 2)     Status: None   Collection Time    11/24/12 12:05 PM      Result Value Range Status   Specimen Description BLOOD HAND LEFT   Final   Special Requests BOTTLES DRAWN AEROBIC AND ANAEROBIC 10CC   Final   Culture  Setup Time 11/24/2012 16:50   Final   Culture     Final   Value:        BLOOD CULTURE RECEIVED NO GROWTH TO DATE CULTURE  WILL BE HELD FOR 5 DAYS BEFORE ISSUING A FINAL NEGATIVE REPORT   Report Status PENDING   Incomplete   Studies/Results: US Renal  11/25/2012   *RADIOLOGY REPORT*  Clinical Data: Fatigue, chills, pain.  Acute pyelonephritis.  RENAL/URINARY TRACT ULTRASOUND COMPLETE  Comparison:  None.  Findings:  Right Kidney:  The right kidney measures 12.6 cm length.  Normal parenchymal echotexture and thickness.  No hydronephrosis.  No solid mass.  No perinephric fluid collections.  Left Kidney:  The left kidney measures 10.6 cm length.  Normal parenchymal echotexture and thickness.  No hydronephrosis.  No solid mass.  No perinephric fluid collections.  Bladder:  The bladder is incompletely distended and is therefore incompletely evaluated but there is no specific evidence of bladder wall thickening or filling defect.  Incidental note of moderately enlarged heterogeneous appearing uterus measuring 9.7 x 7.2-7.9 cm.  Small amount of fluid in the endometrium with otherwise normal endometrial stripe thickness.  IMPRESSION: Normal ultrasound appearance of the kidneys and bladder.  The uterus is incidentally noted to be mildly enlarged and heterogeneous appearing with small amount of fluid in the normal thickness endometrium.   Original Report Authenticated By: Burman Nieves, M.D.   Medications: I have reviewed the patient's current medications. Scheduled Meds: . cefTRIAXone (ROCEPHIN)  IV  1 g Intravenous Q24H  . enoxaparin (LOVENOX) injection  40 mg Subcutaneous Q24H  . sodium chloride  3 mL Intravenous Q12H   Continuous Infusions: . sodium chloride 125 mL/hr at 11/25/12 0057   PRN Meds:.acetaminophen, acetaminophen, HYDROcodone-acetaminophen, ondansetron (ZOFRAN) IV, ondansetron  Assessment/Plan:  This is a 42yo AA female with no PMH who presents with R flank pain, N/V, dysuria and found to have dirty UA c/w acute pyelonephritis.   Acute pyelonephritis - The patient has classic symptoms and presentation for  pyelopnephritis. No known new sexual risk factors but will screen for HIV, chlamydia, gonorrhea. Will order renal ultrasound to look for renal pyelonephritis or hydronephrosis. Cr is 1.02 (GFR <90) which may be elevated from baseline. Other differential include appendicitis, gallbladder disease however are not typical for location or description. Will continue to monitor closely for improvement with treatment.  -Ceftriaxone 1 g q 24 hours x 2 doses--discharged with 6 day course of cipro 500mg  bid -Blood cultures times 2 pending - NGTD (although drawn after antibiotics in ED)  -d/c IVF -F/U urine culture - >100,000 E coli- sensitivities not back yet -  F/U GC/Chlamydia, HIV ab  Hypotension- Patient had episode of BP to 80s/60s yesterday in ED, which is why ED thought it prudent to observe patient overnight. Patient is not orthostatic and her BP has been consistently in the 90s-100s/50s-60s. This is likely her baseline BP as she does not meet SIRS criteria and there is no concern for sepsis at this time.  DVT ppx - lovenox  daily  Dispo: home today  The patient does have a current PCP Windell Hummingbird, MD) and does not need an [facility] hospital follow-up appointment after discharge.  The patient does not have transportation limitations that hinder transportation to clinic appointments.  .Services Needed at time of discharge: Y = Yes, Blank = No PT:   OT:   RN:   Equipment:   Other:     LOS: 1 day   Windell Hummingbird, MD 11/25/2012, 3:13 PM

## 2012-11-25 NOTE — H&P (Signed)
I saw and evaluated the patient. I reviewed the resident's note and confirmed the resident's findings. I agree with the assessment and plan as documented in the resident's note.  Melody Henderson is a 42 yo woman with no previous past medical history who presents with right flank pain and a "dirty" urine very suggestive of pyelonephritis.  She was treated with IVFs and antibiotics with slight relief in pain overnight.  She had mild nausea this morning but no vomiting.  She will be converted to Cipro for a pyelonephritis and discharged home today with [facility].

## 2012-11-25 NOTE — Progress Notes (Signed)
Nsg Discharge Note  Admit Date:  11/24/2012 Discharge date: 11/25/2012   Sharyl Nimrod to be D/C'd Home per MD order.  AVS completed.  Copy for chart, and copy for patient signed, and dated. Patient/caregiver able to verbalize understanding.  Discharge Medication:   Medication List         ciprofloxacin 500 MG tablet  Commonly known as:  CIPRO  Take 1 tablet (500 mg total) by mouth 2 (two) times daily.     HYDROcodone-acetaminophen 5-325 MG per tablet  Commonly known as:  NORCO/VICODIN  Take 1-2 tablets by mouth every 6 hours as needed for pain.     promethazine 25 MG tablet  Commonly known as:  PHENERGAN  Take 1 tablet (25 mg total) by mouth every 6 (six) hours as needed for nausea.        Discharge Assessment: Filed Vitals:   11/25/12 1416  BP: 104/71  Pulse: 67  Temp: 99.3 F (37.4 C)  Resp: 18   Skin clean, dry and intact without evidence of skin break down, no evidence of skin tears noted. IV catheter discontinued intact. Site without signs and symptoms of complications - no redness or edema noted at insertion site, patient denies c/o pain - only slight tenderness at site.  Dressing with slight pressure applied.  D/c Instructions-Education: Discharge instructions given to patient/family with verbalized understanding. D/c education completed with patient/family including follow up instructions, medication list, d/c activities limitations if indicated, with other d/c instructions as indicated by MD - patient able to verbalize understanding, all questions fully answered. Patient instructed to return to ED, call 911, or call MD for any changes in condition.  Patient escorted via WC, and D/C home via private auto.  Celine Dishman Consuella Lose, RN 11/25/2012 2:53 PM

## 2012-11-26 LAB — URINE CULTURE

## 2012-11-26 NOTE — ED Provider Notes (Signed)
Medical screening examination/treatment/procedure(s) were performed by non-physician practitioner and as supervising physician I was immediately available for consultation/collaboration.  John-Adam Wallie Lagrand, M.D.     John-Adam Bee Marchiano, MD 11/26/12 0641 

## 2012-11-27 LAB — GC/CHLAMYDIA PROBE AMP
CT Probe RNA: NEGATIVE
GC Probe RNA: NEGATIVE

## 2012-11-30 LAB — CULTURE, BLOOD (ROUTINE X 2): Culture: NO GROWTH

## 2012-12-01 ENCOUNTER — Encounter: Payer: Self-pay | Admitting: Internal Medicine

## 2012-12-01 ENCOUNTER — Ambulatory Visit: Payer: Self-pay | Admitting: Internal Medicine

## 2013-01-16 ENCOUNTER — Encounter: Payer: Self-pay | Admitting: Internal Medicine

## 2013-02-20 ENCOUNTER — Emergency Department (HOSPITAL_COMMUNITY): Payer: Medicaid Other

## 2013-02-20 ENCOUNTER — Emergency Department (HOSPITAL_COMMUNITY)
Admission: EM | Admit: 2013-02-20 | Discharge: 2013-02-20 | Disposition: A | Discharge status: Home / Self Care | Payer: Medicaid Other | Attending: Emergency Medicine | Admitting: Emergency Medicine

## 2013-02-20 ENCOUNTER — Encounter (HOSPITAL_COMMUNITY): Payer: Self-pay | Admitting: Emergency Medicine

## 2013-02-20 DIAGNOSIS — R3915 Urgency of urination: Secondary | ICD-10-CM | POA: Insufficient documentation

## 2013-02-20 DIAGNOSIS — Z3202 Encounter for pregnancy test, result negative: Secondary | ICD-10-CM | POA: Insufficient documentation

## 2013-02-20 DIAGNOSIS — R109 Unspecified abdominal pain: Secondary | ICD-10-CM | POA: Insufficient documentation

## 2013-02-20 DIAGNOSIS — R300 Dysuria: Secondary | ICD-10-CM | POA: Insufficient documentation

## 2013-02-20 DIAGNOSIS — Z8742 Personal history of other diseases of the female genital tract: Secondary | ICD-10-CM | POA: Insufficient documentation

## 2013-02-20 DIAGNOSIS — Z87442 Personal history of urinary calculi: Secondary | ICD-10-CM | POA: Insufficient documentation

## 2013-02-20 DIAGNOSIS — F17200 Nicotine dependence, unspecified, uncomplicated: Secondary | ICD-10-CM | POA: Insufficient documentation

## 2013-02-20 DIAGNOSIS — R350 Frequency of micturition: Secondary | ICD-10-CM | POA: Insufficient documentation

## 2013-02-20 HISTORY — DX: Calculus of kidney: N20.0

## 2013-02-20 LAB — CBC WITH DIFFERENTIAL/PLATELET
Eosinophils Absolute: 0.1 10*3/uL (ref 0.0–0.7)
Eosinophils Relative: 1 % (ref 0–5)
HCT: 34.6 % — ABNORMAL LOW (ref 36.0–46.0)
Hemoglobin: 12.4 g/dL (ref 12.0–15.0)
Lymphs Abs: 1.9 10*3/uL (ref 0.7–4.0)
MCH: 29.2 pg (ref 26.0–34.0)
MCV: 81.4 fL (ref 78.0–100.0)
Monocytes Relative: 9 % (ref 3–12)
Platelets: 210 10*3/uL (ref 150–400)
RBC: 4.25 MIL/uL (ref 3.87–5.11)

## 2013-02-20 LAB — URINALYSIS, ROUTINE W REFLEX MICROSCOPIC
Ketones, ur: NEGATIVE mg/dL
Leukocytes, UA: NEGATIVE
Nitrite: NEGATIVE
Specific Gravity, Urine: 1.007 (ref 1.005–1.030)
Urobilinogen, UA: 0.2 mg/dL (ref 0.0–1.0)
pH: 6.5 (ref 5.0–8.0)

## 2013-02-20 LAB — POCT I-STAT, CHEM 8
BUN: 10 mg/dL (ref 6–23)
HCT: 39 % (ref 36.0–46.0)
Hemoglobin: 13.3 g/dL (ref 12.0–15.0)
Potassium: 3.4 mEq/L — ABNORMAL LOW (ref 3.5–5.1)
Sodium: 140 mEq/L (ref 135–145)

## 2013-02-20 LAB — URINE MICROSCOPIC-ADD ON

## 2013-02-20 MED ORDER — IOHEXOL 300 MG/ML  SOLN
100.0000 mL | Freq: Once | INTRAMUSCULAR | Status: AC | PRN
  Administered 2013-02-20: 100 mL via INTRAVENOUS

## 2013-02-20 MED ORDER — HYDROMORPHONE HCL PF 1 MG/ML IJ SOLN
1.0000 mg | Freq: Once | INTRAMUSCULAR | Status: AC
  Administered 2013-02-20: 1 mg via INTRAVENOUS
  Filled 2013-02-20: qty 1

## 2013-02-20 MED ORDER — KETOROLAC TROMETHAMINE 30 MG/ML IJ SOLN
30.0000 mg | Freq: Once | INTRAMUSCULAR | Status: AC
  Administered 2013-02-20: 30 mg via INTRAVENOUS
  Filled 2013-02-20: qty 1

## 2013-02-20 MED ORDER — OXYCODONE-ACETAMINOPHEN 5-325 MG PO TABS: 2.0000 | ORAL_TABLET | ORAL | Status: DC | PRN

## 2013-02-20 MED ORDER — SODIUM CHLORIDE 0.9 % IV BOLUS (SEPSIS)
1000.0000 mL | Freq: Once | INTRAVENOUS | Status: AC
  Administered 2013-02-20: 1000 mL via INTRAVENOUS

## 2013-02-20 NOTE — ED Provider Notes (Signed)
Medical screening examination/treatment/procedure(s) were performed by non-physician practitioner and as supervising physician I was immediately available for consultation/collaboration.  Flint Melter, MD 02/20/13 [phone]

## 2013-02-20 NOTE — ED Notes (Signed)
Pt rolled to bathroom on stedy and returned.

## 2013-02-20 NOTE — ED Notes (Addendum)
Pt reports "My kidney's hurt." Pt c/o pain across low back R>L, pain with urination and joint pain in all joints. Pt denies n/v. Pt has history of kidney stones and pain feels like that now. Pt also reports feeling constantly thirsty and decrease in appetite.

## 2013-02-20 NOTE — ED Notes (Signed)
NP at bedside.

## 2013-02-20 NOTE — ED Notes (Signed)
Patient transported to CT by x-ray tech

## 2013-02-20 NOTE — ED Provider Notes (Signed)
CSN: [id number]     Arrival date & time 02/20/13  1003 History   First MD Initiated Contact with Patient 02/20/13 1040     Chief Complaint  Patient presents with  . Back Pain  . Joint Pain   (Consider location/radiation/quality/duration/timing/severity/associated sxs/prior Treatment) Patient is a 42 y.o. female presenting with dysuria. The history is provided by the patient. No language interpreter was used.  Dysuria Pain quality:  Burning Pain severity:  Moderate Duration:  2 days Recent urinary tract infections: yes   Relieved by:  Cranberry juice Urinary symptoms: frequent urination   Urinary symptoms: no hematuria   Associated symptoms: flank pain   Associated symptoms: no abdominal pain, no fever, no nausea, no vaginal discharge and no vomiting   Risk factors: hx of pyelonephritis   Pt is a 42 year old female pt who presents with dysuria and right sided flank pain. She has a history of pyelonephritis and kidney stones. She denies fever, chills or hematuria. She reports that she is tender in her right flank area. She denies any recent infection, vaginal discharge or odor. No pelvic pain. She reports urgency and frequent urination.   Past Medical History  Diagnosis Date  . Pyelonephritis 11/24/2012  . Kidney stones    History reviewed. No pertinent past surgical history. No family history on file. History  Substance Use Topics  . Smoking status: Light Tobacco Smoker    Types: Cigarettes  . Smokeless tobacco: Never Used     Comment: 4-5 cigarettes per day  . Alcohol Use: No   OB History   Grav Para Term Preterm Abortions TAB SAB Ect Mult Living                 Review of Systems  Constitutional: Negative for fever.  Gastrointestinal: Negative for nausea, vomiting and abdominal pain.  Genitourinary: Positive for dysuria, urgency and flank pain. Negative for vaginal discharge.  All other systems reviewed and are negative.    Allergies  Review of patient's  allergies indicates no known allergies.  Home Medications  No current outpatient prescriptions on file. BP 112/68  Pulse 75  Temp(Src) 98.2 F (36.8 C) (Oral)  Resp 18  Ht 5\' 6"  (1.676 m)  Wt 162 lb (73.483 kg)  BMI 26.16 kg/m2  SpO2 98%  LMP 01/23/2013 Physical Exam  Nursing note and vitals reviewed. Constitutional: She appears well-developed and well-nourished. No distress.  HENT:  Head: Normocephalic and atraumatic.  Mouth/Throat: Oropharynx is clear and moist.  Eyes: Conjunctivae are normal. Pupils are equal, round, and reactive to light.  Neck: Normal range of motion. Neck supple. No tracheal deviation present. No thyromegaly present.  Cardiovascular: Normal rate, regular rhythm, normal heart sounds and intact distal pulses.   Pulmonary/Chest: Effort normal and breath sounds normal. No respiratory distress. She has no wheezes.  Abdominal: Soft. Bowel sounds are normal. She exhibits no distension. There is no tenderness. There is no rebound and no guarding.  Musculoskeletal: Normal range of motion.  Lymphadenopathy:    She has no cervical adenopathy.  Neurological: She is alert.  Skin: Skin is warm and dry.  Psychiatric: She has a normal mood and affect. Her behavior is normal. Judgment and thought content normal.    ED Course  Procedures (including critical care time) Labs Review Labs Reviewed  URINALYSIS, ROUTINE W REFLEX MICROSCOPIC   Imaging Review No results found.  EKG Interpretation   None       MDM   1. Flank pain  CBC and chem 8; unremarkable. No UTI. Urine sent for culture. Abd/Pelvsi CT; No acute findings and nothing to explain pt's pattern of pain. Pain managed here with toradol and dilaudid x1. Percocet prescription to go home. Follow-up with PCP.        Irish Elders, NP 02/20/13 [phone]

## 2013-02-21 LAB — URINE CULTURE: Culture: NO GROWTH

## 2013-02-22 ENCOUNTER — Encounter (HOSPITAL_COMMUNITY): Payer: Self-pay | Admitting: Emergency Medicine

## 2013-02-22 DIAGNOSIS — M545 Low back pain, unspecified: Secondary | ICD-10-CM | POA: Insufficient documentation

## 2013-02-22 DIAGNOSIS — M5430 Sciatica, unspecified side: Secondary | ICD-10-CM | POA: Insufficient documentation

## 2013-02-22 DIAGNOSIS — F17200 Nicotine dependence, unspecified, uncomplicated: Secondary | ICD-10-CM | POA: Insufficient documentation

## 2013-02-22 DIAGNOSIS — Z87442 Personal history of urinary calculi: Secondary | ICD-10-CM | POA: Insufficient documentation

## 2013-02-22 DIAGNOSIS — IMO0001 Reserved for inherently not codable concepts without codable children: Secondary | ICD-10-CM | POA: Insufficient documentation

## 2013-02-22 DIAGNOSIS — R350 Frequency of micturition: Secondary | ICD-10-CM | POA: Insufficient documentation

## 2013-02-22 NOTE — ED Notes (Signed)
Presents with bilateral flank pain for 4 days, seen here on 10/20, dx with a renal cyst and sent home but told to come back if pain worsened. Pain is worse today and affecting activities of daily life.  Pt is restless and visibly uncomfortable. Pain 10/10

## 2013-02-23 ENCOUNTER — Emergency Department (HOSPITAL_COMMUNITY)
Admission: EM | Admit: 2013-02-23 | Discharge: 2013-02-23 | Disposition: A | Discharge status: Home / Self Care | Payer: Medicaid Other | Attending: Emergency Medicine | Admitting: Emergency Medicine

## 2013-02-23 DIAGNOSIS — M5431 Sciatica, right side: Secondary | ICD-10-CM

## 2013-02-23 LAB — CBC WITH DIFFERENTIAL/PLATELET
Basophils Absolute: 0 10*3/uL (ref 0.0–0.1)
Basophils Relative: 1 % (ref 0–1)
Eosinophils Absolute: 0.1 10*3/uL (ref 0.0–0.7)
Eosinophils Relative: 1 % (ref 0–5)
Lymphocytes Relative: 49 % — ABNORMAL HIGH (ref 12–46)
MCHC: 36.2 g/dL — ABNORMAL HIGH (ref 30.0–36.0)
MCV: 82.6 fL (ref 78.0–100.0)
Monocytes Absolute: 0.6 10*3/uL (ref 0.1–1.0)
Monocytes Relative: 8 % (ref 3–12)
Neutro Abs: 3.2 10*3/uL (ref 1.7–7.7)
Platelets: 267 10*3/uL (ref 150–400)
RBC: 4.48 MIL/uL (ref 3.87–5.11)
RDW: 13.7 % (ref 11.5–15.5)
WBC: 7.6 10*3/uL (ref 4.0–10.5)

## 2013-02-23 LAB — BASIC METABOLIC PANEL
BUN: 7 mg/dL (ref 6–23)
CO2: 29 mEq/L (ref 19–32)
Calcium: 9.4 mg/dL (ref 8.4–10.5)
Chloride: 99 mEq/L (ref 96–112)
Creatinine, Ser: 0.89 mg/dL (ref 0.50–1.10)
GFR calc non Af Amer: 78 mL/min — ABNORMAL LOW (ref >90)

## 2013-02-23 LAB — URINALYSIS, ROUTINE W REFLEX MICROSCOPIC
Bilirubin Urine: NEGATIVE
Glucose, UA: NEGATIVE mg/dL
Nitrite: NEGATIVE
Specific Gravity, Urine: 1.023 (ref 1.005–1.030)
Urobilinogen, UA: 1 mg/dL (ref 0.0–1.0)

## 2013-02-23 LAB — URINE MICROSCOPIC-ADD ON

## 2013-02-23 MED ORDER — METHOCARBAMOL 500 MG PO TABS: 500.0000 mg | ORAL_TABLET | Freq: Two times a day (BID) | ORAL | Status: DC

## 2013-02-23 MED ORDER — IBUPROFEN 600 MG PO TABS: 600.0000 mg | ORAL_TABLET | Freq: Four times a day (QID) | ORAL | Status: DC | PRN

## 2013-02-23 MED ORDER — HYDROMORPHONE HCL PF 2 MG/ML IJ SOLN: 2.0000 mg | Freq: Once | INTRAMUSCULAR | Status: DC

## 2013-02-23 MED ORDER — KETOROLAC TROMETHAMINE 60 MG/2ML IM SOLN
60.0000 mg | Freq: Once | INTRAMUSCULAR | Status: AC
  Administered 2013-02-23: 60 mg via INTRAMUSCULAR
  Filled 2013-02-23: qty 2

## 2013-02-23 MED ORDER — METHOCARBAMOL 500 MG PO TABS
500.0000 mg | ORAL_TABLET | Freq: Once | ORAL | Status: AC
  Administered 2013-02-23: 500 mg via ORAL
  Filled 2013-02-23: qty 1

## 2013-02-23 NOTE — ED Provider Notes (Signed)
CSN: [id number]     Arrival date & time 02/22/13  2319 History   First MD Initiated Contact with Patient 02/23/13 0110     Chief Complaint  Patient presents with  . Flank Pain   (Consider location/radiation/quality/duration/timing/severity/associated sxs/prior Treatment) HPI Shouldn't presents with 4 days of lower back pain. She was seen 2 days ago and had a negative CT abdomen and pelvis without contrast. Her urine cultures performed at that time were negative. Patient has had no fevers or chills. She denies any specific trauma or heavy lifting. The pain is worse on her right side and does radiate down her right leg just to the knee. She has no incontinence, weakness or numbness. She does admit to dark color urine and urinary frequency. She denies dysuria. Past Medical History  Diagnosis Date  . Pyelonephritis 11/24/2012  . Kidney stones    History reviewed. No pertinent past surgical history. History reviewed. No pertinent family history. History  Substance Use Topics  . Smoking status: Light Tobacco Smoker    Types: Cigarettes  . Smokeless tobacco: Never Used     Comment: 4-5 cigarettes per day  . Alcohol Use: No   OB History   Grav Para Term Preterm Abortions TAB SAB Ect Mult Living                 Review of Systems  Constitutional: Negative for fever and chills.  Respiratory: Negative for shortness of breath.   Cardiovascular: Negative for chest pain.  Gastrointestinal: Negative for nausea, vomiting, abdominal pain, diarrhea and constipation.  Genitourinary: Positive for frequency. Negative for dysuria, hematuria, flank pain, vaginal bleeding, vaginal discharge and pelvic pain.  Musculoskeletal: Positive for back pain and myalgias.  Skin: Negative for rash and wound.  Neurological: Negative for dizziness, weakness, light-headedness, numbness and headaches.  All other systems reviewed and are negative.    Allergies  Review of patient's allergies indicates no known  allergies.  Home Medications   Current Outpatient Rx  Name  Route  Sig  Dispense  Refill  . oxyCODONE-acetaminophen (PERCOCET/ROXICET) 5-325 MG per tablet   Oral   Take 2 tablets by mouth every 4 (four) hours as needed for pain.   15 tablet   0    BP 110/72  Pulse 84  Temp(Src) 98.5 F (36.9 C) (Oral)  Resp 15  Wt 161 lb 5 oz (73.171 kg)  BMI 26.05 kg/m2  SpO2 99%  LMP 01/23/2013 Physical Exam  Nursing note and vitals reviewed. Constitutional: She is oriented to person, place, and time. She appears well-developed and well-nourished. No distress.  HENT:  Head: Normocephalic and atraumatic.  Mouth/Throat: Oropharynx is clear and moist.  Eyes: EOM are normal. Pupils are equal, round, and reactive to light.  Neck: Normal range of motion. Neck supple.  Cardiovascular: Normal rate and regular rhythm.   Pulmonary/Chest: Effort normal and breath sounds normal. No respiratory distress. She has no wheezes. She has no rales.  Abdominal: Soft. Bowel sounds are normal. She exhibits no distension and no mass. There is no tenderness. There is no rebound and no guarding.  Musculoskeletal: Normal range of motion. She exhibits no edema and no tenderness.  Patient has no CVA tenderness bilaterally. She does have right greater than left lumbar paraspinal tenderness. She has negative straight leg raise. She has 2+ dorsalis pedis pulses in both lower extremities.  Neurological: She is alert and oriented to person, place, and time.  Patient is alert and oriented x3 with clear, goal oriented speech.  Patient has 5/5 motor in all extremities. Sensation is intact to light touch  Skin: Skin is warm and dry. No rash noted. No erythema.  Psychiatric: She has a normal mood and affect. Her behavior is normal.    ED Course  Procedures (including critical care time) Labs Review Labs Reviewed  URINALYSIS, ROUTINE W REFLEX MICROSCOPIC - Abnormal; Notable for the following:    APPearance CLOUDY (*)    Hgb  urine dipstick LARGE (*)    Ketones, ur 15 (*)    All other components within normal limits  CBC WITH DIFFERENTIAL - Abnormal; Notable for the following:    MCHC 36.2 (*)    Neutrophils Relative % 42 (*)    Lymphocytes Relative 49 (*)    All other components within normal limits  BASIC METABOLIC PANEL - Abnormal; Notable for the following:    GFR calc non Af Amer 78 (*)    All other components within normal limits  URINE MICROSCOPIC-ADD ON - Abnormal; Notable for the following:    Squamous Epithelial / LPF MANY (*)    Bacteria, UA FEW (*)    All other components within normal limits   Imaging Review No results found.  EKG Interpretation   None       MDM   Exam is consistent with sciatica. She has no red flag worrisome signs for more serious cause of her pain. Her urine does have some blood in it. This appears to be chronic based on her prior UAs. We'll treat patient's symptoms. Advised patient to drink more fluids. We'll give urology information but have advised her to followup with primary Dr. to coordinate care. Patient and husband are in agreement with plan. This is his feeling much better after the Toradol and Robaxin. We'll discharge home with ibuprofen and Robaxin. Return precautions have been given. Patient to follow up with primary Dr. as discussed earlier.  Loren Racer, MD 02/23/13 [phone]

## 2013-03-09 ENCOUNTER — Other Ambulatory Visit: Payer: Self-pay

## 2013-05-29 ENCOUNTER — Encounter (HOSPITAL_COMMUNITY): Payer: Self-pay | Admitting: Emergency Medicine

## 2013-05-29 ENCOUNTER — Emergency Department (HOSPITAL_COMMUNITY)
Admission: EM | Admit: 2013-05-29 | Discharge: 2013-05-30 | Disposition: A | Discharge status: Home / Self Care | Payer: Medicaid Other | Attending: Emergency Medicine | Admitting: Emergency Medicine

## 2013-05-29 DIAGNOSIS — IMO0002 Reserved for concepts with insufficient information to code with codable children: Secondary | ICD-10-CM | POA: Insufficient documentation

## 2013-05-29 DIAGNOSIS — X500XXA Overexertion from strenuous movement or load, initial encounter: Secondary | ICD-10-CM | POA: Insufficient documentation

## 2013-05-29 DIAGNOSIS — F17200 Nicotine dependence, unspecified, uncomplicated: Secondary | ICD-10-CM | POA: Insufficient documentation

## 2013-05-29 DIAGNOSIS — Y92009 Unspecified place in unspecified non-institutional (private) residence as the place of occurrence of the external cause: Secondary | ICD-10-CM | POA: Insufficient documentation

## 2013-05-29 DIAGNOSIS — Y93E9 Activity, other interior property and clothing maintenance: Secondary | ICD-10-CM | POA: Insufficient documentation

## 2013-05-29 DIAGNOSIS — Z87448 Personal history of other diseases of urinary system: Secondary | ICD-10-CM | POA: Insufficient documentation

## 2013-05-29 DIAGNOSIS — M62830 Muscle spasm of back: Secondary | ICD-10-CM

## 2013-05-29 DIAGNOSIS — Z87442 Personal history of urinary calculi: Secondary | ICD-10-CM | POA: Insufficient documentation

## 2013-05-29 MED ORDER — IBUPROFEN 400 MG PO TABS
800.0000 mg | ORAL_TABLET | Freq: Once | ORAL | Status: AC
  Administered 2013-05-29: 800 mg via ORAL
  Filled 2013-05-29: qty 2

## 2013-05-29 MED ORDER — IBUPROFEN 800 MG PO TABS: 800.0000 mg | ORAL_TABLET | Freq: Three times a day (TID) | ORAL | Status: AC

## 2013-05-29 MED ORDER — HYDROCODONE-ACETAMINOPHEN 5-325 MG PO TABS
1.0000 | ORAL_TABLET | Freq: Once | ORAL | Status: AC
  Administered 2013-05-29: 1 via ORAL
  Filled 2013-05-29: qty 1

## 2013-05-29 MED ORDER — DIAZEPAM 5 MG PO TABS: 5.0000 mg | ORAL_TABLET | Freq: Four times a day (QID) | ORAL | Status: AC | PRN

## 2013-05-29 MED ORDER — DIAZEPAM 5 MG PO TABS
5.0000 mg | ORAL_TABLET | Freq: Once | ORAL | Status: AC
  Administered 2013-05-29: 5 mg via ORAL
  Filled 2013-05-29: qty 1

## 2013-05-29 NOTE — ED Notes (Signed)
C/O left flank pain that radiates down posterior left leg, moves around to top of left foot/toes since her leg gave out on her as she cleaned her closet today at approx 1420.  Reports she has been "in the bed since."  Has not taken any OTC meds for pain.

## 2013-05-29 NOTE — ED Notes (Signed)
Patient states she was cleaning out her closet and she felt like her left hip was going to give out.  Now c/o pain across the left lower back that radiates down the outside of her left leg

## 2013-05-29 NOTE — ED Provider Notes (Signed)
CSN: [id number]     Arrival date & time 05/29/13  2241 History   First MD Initiated Contact with Patient 05/29/13 2331     Chief Complaint  Patient presents with  . Back Pain   HPI  History provided by the patient. Patient is a 43 year old female with history of kidney stones who presents with complaints of acute lower back pain radiating down the left lower extremity. Patient states that she was being over to grab a bag out of her closet and she suddenly had sharp pain causing her left leg to give out. She reports pain at the low part of the back it radiates to the posterior aspect of her left leg all the way to the calf. Pain has been persistent and is worse with any movements. She reports feeling stiff in the back. She did not use any medications or treatment for her symptoms. She denies any numbness or weakness to the foot and leg. Denies any urinary or fecal incontinence, urinary retention or perineal numbness. She does report having some similar back pains in the past but never this intense.   Past Medical History  Diagnosis Date  . Pyelonephritis 11/24/2012  . Kidney stones    History reviewed. No pertinent past surgical history. History reviewed. No pertinent family history. History  Substance Use Topics  . Smoking status: Light Tobacco Smoker    Types: Cigarettes  . Smokeless tobacco: Never Used     Comment: 4-5 cigarettes per day  . Alcohol Use: No   OB History   Grav Para Term Preterm Abortions TAB SAB Ect Mult Living                 Review of Systems  Constitutional: Negative for fever, chills and diaphoresis.  Musculoskeletal: Positive for back pain.  Neurological: Negative for weakness and numbness.  All other systems reviewed and are negative.    Allergies  Review of patient's allergies indicates no known allergies.  Home Medications   Current Outpatient Rx  Name  Route  Sig  Dispense  Refill  . ibuprofen (ADVIL,MOTRIN) 600 MG tablet   Oral   Take 1  tablet (600 mg total) by mouth every 6 (six) hours as needed for pain.   30 tablet   0   . methocarbamol (ROBAXIN) 500 MG tablet   Oral   Take 1 tablet (500 mg total) by mouth 2 (two) times daily.   20 tablet   0   . oxyCODONE-acetaminophen (PERCOCET/ROXICET) 5-325 MG per tablet   Oral   Take 2 tablets by mouth every 4 (four) hours as needed for pain.   15 tablet   0    BP 122/69  Pulse 73  Temp(Src) 98.1 F (36.7 C)  Resp 20  Wt 161 lb 8 oz (73.256 kg)  SpO2 97%  LMP 05/22/2013 Physical Exam  Nursing note and vitals reviewed. Constitutional: She is oriented to person, place, and time. She appears well-developed and well-nourished. No distress.  HENT:  Head: Normocephalic and atraumatic.  Cardiovascular: Normal rate and regular rhythm.   Pulmonary/Chest: Effort normal and breath sounds normal. No respiratory distress. She has no wheezes. She has no rales.  Abdominal: Soft. There is no tenderness. There is no rebound.  No CVA tenderness  Musculoskeletal: Normal range of motion. She exhibits no edema.       Lumbar back: She exhibits tenderness. She exhibits no bony tenderness, no swelling and no edema.       Back:  Negative straight leg test  Neurological: She is alert and oriented to person, place, and time. She has normal strength. No sensory deficit. Gait normal.  Skin: Skin is warm and dry. No rash noted.  Psychiatric: She has a normal mood and affect. Her behavior is normal.    ED Course  Procedures   DIAGNOSTIC STUDIES: Oxygen Saturation is 97% on room air.    COORDINATION OF CARE:  Nursing notes reviewed. Vital signs reviewed. Initial pt interview and examination performed.   11:53 PM-patient seen and evaluated. She appears in mild discomfort no acute distress. Does not appear in severe pain. She has no concerning or red flag symptoms. There was no significant injury or trauma. No indications for x-rays at this time.    Treatment plan  initiated: Medications  diazepam (VALIUM) tablet 5 mg (not administered)  ibuprofen (ADVIL,MOTRIN) tablet 800 mg (not administered)  HYDROcodone-acetaminophen (NORCO/VICODIN) 5-325 MG per tablet 1 tablet (not administered)       MDM   1. Muscle spasm of back        Angus Seller, [place] 05/30/13 [phone]

## 2013-05-30 NOTE — ED Provider Notes (Signed)
Medical screening examination/treatment/procedure(s) were performed by non-physician practitioner and as supervising physician I was immediately available for consultation/collaboration.   Melody NielsenBrian Kyrra Prada, MD 05/30/13 [phone]

## 2013-05-30 NOTE — Discharge Instructions (Signed)
You may alternate heat and ice to help with your pain.  Follow up with your doctor for continued evaluation and treatment.    Muscle Cramps and Spasms Muscle cramps and spasms occur when a muscle or muscles tighten and you have no control over this tightening (involuntary muscle contraction). They are a common problem and can develop in any muscle. The most common place is in the calf muscles of the leg. Both muscle cramps and muscle spasms are involuntary muscle contractions, but they also have differences:   Muscle cramps are sporadic and painful. They may last a few seconds to a quarter of an hour. Muscle cramps are often more forceful and last longer than muscle spasms.  Muscle spasms may or may not be painful. They may also last just a few seconds or much longer. CAUSES  It is uncommon for cramps or spasms to be due to a serious underlying problem. In many cases, the cause of cramps or spasms is unknown. Some common causes are:   Overexertion.   Overuse from repetitive motions (doing the same thing over and over).   Remaining in a certain position for a long period of time.   Improper preparation, form, or technique while performing a sport or activity.   Dehydration.   Injury.   Side effects of some medicines.   Abnormally low levels of the salts and ions in your blood (electrolytes), especially potassium and calcium. This could happen if you are taking water pills (diuretics) or you are pregnant.  Some underlying medical problems can make it more likely to develop cramps or spasms. These include, but are not limited to:   Diabetes.   Parkinson disease.   Hormone disorders, such as thyroid problems.   Alcohol abuse.   Diseases specific to muscles, joints, and bones.   Blood vessel disease where not enough blood is getting to the muscles.  HOME CARE INSTRUCTIONS   Stay well hydrated. Drink enough water and fluids to keep your urine clear or pale  yellow.  It may be helpful to massage, stretch, and relax the affected muscle.  For tight or tense muscles, use a warm towel, heating pad, or hot shower water directed to the affected area.  If you are sore or have pain after a cramp or spasm, applying ice to the affected area may relieve discomfort.  Put ice in a plastic bag.  Place a towel between your skin and the bag.  Leave the ice on for 15-20 minutes, 03-04 times a day.  Medicines used to treat a known cause of cramps or spasms may help reduce their frequency or severity. Only take over-the-counter or prescription medicines as directed by your caregiver. SEEK MEDICAL CARE IF:  Your cramps or spasms get more severe, more frequent, or do not improve over time.  MAKE SURE YOU:   Understand these instructions.  Will watch your condition.  Will get help right away if you are not doing well or get worse. Document Released: 10/10/2001 Document Revised: 08/15/2012 Document Reviewed: 04/06/2012 [facility] Patient Information 2014 [place], [place].    Back Exercises Back exercises help treat and prevent back injuries. The goal is to increase your strength in your belly (abdominal) and back muscles. These exercises can also help with flexibility. Start these exercises when told by your doctor. HOME CARE Back exercises include: Pelvic Tilt.  Lie on your back with your knees bent. Tilt your pelvis until the lower part of your back is against the floor. Hold this  position 5 to 10 sec. Repeat this exercise 5 to 10 times. Knee to Chest.  Pull 1 knee up against your chest and hold for 20 to 30 seconds. Repeat this with the other knee. This may be done with the other leg straight or bent, whichever feels better. Then, pull both knees up against your chest. Sit-Ups or Curl-Ups.  Bend your knees 90 degrees. Start with tilting your pelvis, and do a partial, slow sit-up. Only lift your upper half 30 to 45 degrees off the floor. Take at least 2  to 3 seonds for each sit-up. Do not do sit-ups with your knees out straight. If partial sit-ups are difficult, simply do the above but with only tightening your belly (abdominal) muscles and holding it as told. Hip-Lift.  Lie on your back with your knees flexed 90 degrees. Push down with your feet and shoulders as you raise your hips 2 inches off the floor. Hold for 10 seconds, repeat 5 to 10 times. Back Arches.  Lie on your stomach. Prop yourself up on bent elbows. Slowly press on your hands, causing an arch in your low back. Repeat 3 to 5 times. Shoulder-Lifts.  Lie face down with arms beside your body. Keep hips and belly pressed to floor as you slowly lift your head and shoulders off the floor. Do not overdo your exercises. Be careful in the beginning. Exercises may cause you some mild back discomfort. If the pain lasts for more than 15 minutes, stop the exercises until you see your doctor. Improvement with exercise for back problems is slow.  Document Released: 05/23/2010 Document Revised: 07/13/2011 Document Reviewed: 02/19/2011 [facility] Patient Information 2014 [place], [place].

## 2013-07-15 ENCOUNTER — Emergency Department (HOSPITAL_COMMUNITY): Payer: Medicaid Other

## 2013-07-15 ENCOUNTER — Emergency Department (HOSPITAL_COMMUNITY)
Admission: EM | Admit: 2013-07-15 | Discharge: 2013-07-15 | Disposition: A | Discharge status: Home / Self Care | Payer: Medicaid Other | Attending: Emergency Medicine | Admitting: Emergency Medicine

## 2013-07-15 ENCOUNTER — Encounter (HOSPITAL_COMMUNITY): Payer: Self-pay | Admitting: Emergency Medicine

## 2013-07-15 DIAGNOSIS — N12 Tubulo-interstitial nephritis, not specified as acute or chronic: Secondary | ICD-10-CM | POA: Insufficient documentation

## 2013-07-15 DIAGNOSIS — M549 Dorsalgia, unspecified: Secondary | ICD-10-CM

## 2013-07-15 DIAGNOSIS — Z87440 Personal history of urinary (tract) infections: Secondary | ICD-10-CM | POA: Insufficient documentation

## 2013-07-15 DIAGNOSIS — Z79899 Other long term (current) drug therapy: Secondary | ICD-10-CM | POA: Insufficient documentation

## 2013-07-15 DIAGNOSIS — M546 Pain in thoracic spine: Secondary | ICD-10-CM | POA: Insufficient documentation

## 2013-07-15 DIAGNOSIS — M25519 Pain in unspecified shoulder: Secondary | ICD-10-CM | POA: Insufficient documentation

## 2013-07-15 DIAGNOSIS — F17200 Nicotine dependence, unspecified, uncomplicated: Secondary | ICD-10-CM | POA: Insufficient documentation

## 2013-07-15 MED ORDER — KETOROLAC TROMETHAMINE 60 MG/2ML IM SOLN
30.0000 mg | Freq: Once | INTRAMUSCULAR | Status: AC
  Administered 2013-07-15: 30 mg via INTRAMUSCULAR
  Filled 2013-07-15: qty 2

## 2013-07-15 MED ORDER — METHOCARBAMOL 500 MG PO TABS: 500.0000 mg | ORAL_TABLET | Freq: Four times a day (QID) | ORAL | Status: AC | PRN

## 2013-07-15 NOTE — ED Provider Notes (Signed)
CSN: [id number]     Arrival date & time 07/15/13  0740 History   First MD Initiated Contact with Patient 07/15/13 0800     Chief Complaint  Patient presents with  . Neck Pain  . Shoulder Pain     (Consider location/radiation/quality/duration/timing/severity/associated sxs/prior Treatment) The history is provided by the patient.   Patient presents with left neck, upper back pain that began three days ago but worsened yesterday.  Pain is described as throbbing and squeezing, is constant, worse with movement of head to the left and light palpation, better with holding pressure over it.  Pain radiates down left arm with tingling in the fingertips.  Denies fevers, weakness of the extremities, CP, SOB, cough.  No hx CA.  No IVDU.  States this occurs approximately every 3 months and lasts 2-3 weeks for the past several years.  This is slightly worse than usual.  She has never had it checked out before.  Has taken ibuprofen and aleve without relief.  Has an appt with new PCP [facility] March 24.     Past Medical History  Diagnosis Date  . Pyelonephritis 11/24/2012  . Kidney stones    History reviewed. No pertinent past surgical history. No family history on file. History  Substance Use Topics  . Smoking status: Light Tobacco Smoker    Types: Cigarettes  . Smokeless tobacco: Never Used     Comment: 4-5 cigarettes per day  . Alcohol Use: No   OB History   Grav Para Term Preterm Abortions TAB SAB Ect Mult Living                 Review of Systems  Constitutional: Negative for fever and chills.  HENT: Negative for sore throat.   Eyes: Negative for discharge.  Respiratory: Negative for cough, shortness of breath, wheezing and stridor.   Cardiovascular: Negative for chest pain.  Gastrointestinal: Negative for abdominal pain.  Genitourinary: Negative for menstrual problem.  Musculoskeletal: Positive for back pain and neck pain.  Allergic/Immunologic: Negative for  immunocompromised state.  All other systems reviewed and are negative.      Allergies  Review of patient's allergies indicates no known allergies.  Home Medications   Current Outpatient Rx  Name  Route  Sig  Dispense  Refill  . diazepam (VALIUM) 5 MG tablet   Oral   Take 1 tablet (5 mg total) by mouth every 6 (six) hours as needed for anxiety (spasms).   10 tablet   0   . ibuprofen (ADVIL,MOTRIN) 600 MG tablet   Oral   Take 1 tablet (600 mg total) by mouth every 6 (six) hours as needed for pain.   30 tablet   0   . ibuprofen (ADVIL,MOTRIN) 800 MG tablet   Oral   Take 1 tablet (800 mg total) by mouth 3 (three) times daily.   21 tablet   0   . methocarbamol (ROBAXIN) 500 MG tablet   Oral   Take 1 tablet (500 mg total) by mouth 2 (two) times daily.   20 tablet   0   . oxyCODONE-acetaminophen (PERCOCET/ROXICET) 5-325 MG per tablet   Oral   Take 2 tablets by mouth every 4 (four) hours as needed for pain.   15 tablet   0    BP 105/63  Pulse 73  Temp(Src) 98.8 F (37.1 C) (Oral)  Resp 20  Ht 5\' 6"  (1.676 m)  Wt 167 lb (75.751 kg)  BMI 26.97 kg/m2  SpO2 98% Physical Exam  Nursing note and vitals reviewed. Constitutional: She appears well-developed and well-nourished. No distress.  HENT:  Head: Normocephalic and atraumatic.  Neck: Neck supple.  Cardiovascular: Normal rate and regular rhythm.   Pulmonary/Chest: Effort normal and breath sounds normal. No respiratory distress. She has no wheezes. She has no rales.  Musculoskeletal:       Arms: Extremities:  Strength 5/5, sensation intact, distal pulses intact.     Neurological: She is alert.  Skin: She is not diaphoretic.    ED Course  Procedures (including critical care time) Labs Review Labs Reviewed - No data to display Imaging Review Dg Cervical Spine Complete  07/15/2013   CLINICAL DATA:  Neck pain  EXAM: CERVICAL SPINE  4+ VIEWS  COMPARISON:  None.  FINDINGS: Seven cervical segments are well  visualized. Mild osteophytic changes are noted at C5-6. No significant neural foraminal changes are noted. No acute soft tissue changes are seen. No acute fracture is noted.  IMPRESSION: Mild degenerative change without acute abnormality.   Electronically Signed   By: Alcide Clever M.D.   On: 07/15/2013 09:30   Dg Thoracic Spine 2 View  07/15/2013   CLINICAL DATA:  Back pain  EXAM: THORACIC SPINE - 2 VIEW  COMPARISON:  None.  FINDINGS: There is no evidence of thoracic spine fracture. Alignment is normal. No other significant bone abnormalities are identified.  IMPRESSION: No acute abnormality noted.   Electronically Signed   By: Alcide Clever M.D.   On: 07/15/2013 09:29     EKG Interpretation None      MDM   Final diagnoses:  Upper back pain on left side    Pt with left upper back and neck pain with radiation down left arm, associated tingling in fingertips.  No weakness.  No injury.  No red flags.  Reproducible by palpation, likely muscle spasm vs radiculopathy. Xrays show mild osteophytic changes at c5-6, otherwise normal.   Pt d/c home with robaxin, PCP follow up.  Discussed result, findings, treatment, and follow up  with patient.  Pt given return precautions.  Pt verbalizes understanding and agrees with plan.        Trixie Dredge, PA-C 07/15/13 1108

## 2013-07-15 NOTE — ED Notes (Signed)
Pt presents to department for evaluation of neck pain radiating to bilateral shoulders and upper back. 9/10 pain at the time, increases with movement. Pt is alert and oriented x4. No signs of distress noted.

## 2013-07-15 NOTE — Discharge Instructions (Signed)
Read the information below.  Use the prescribed medication as directed.  Please discuss all new medications with your pharmacist.  You may return to the Emergency Department at any time for worsening condition or any new symptoms that concern you.  If there is any possibility that you might be pregnant, please let your health care provider know and discuss this with the pharmacist to ensure medication safety.   If you develop fevers, loss of control of bowel or bladder, weakness or numbness in your arms or legs, or are unable to walk, return to the ER for a recheck.    Musculoskeletal Pain Musculoskeletal pain is muscle and boney aches and pains. These pains can occur in any part of the body. Your caregiver may treat you without knowing the cause of the pain. They may treat you if blood or urine tests, X-rays, and other tests were normal.  CAUSES There is often not a definite cause or reason for these pains. These pains may be caused by a type of germ (virus). The discomfort may also come from overuse. Overuse includes working out too hard when your body is not fit. Boney aches also come from weather changes. Bone is sensitive to atmospheric pressure changes. HOME CARE INSTRUCTIONS   Ask when your test results will be ready. Make sure you get your test results.  Only take over-the-counter or prescription medicines for pain, discomfort, or fever as directed by your caregiver. If you were given medications for your condition, do not drive, operate machinery or power tools, or sign legal documents for 24 hours. Do not drink alcohol. Do not take sleeping pills or other medications that may interfere with treatment.  Continue all activities unless the activities cause more pain. When the pain lessens, slowly resume normal activities. Gradually increase the intensity and duration of the activities or exercise.  During periods of severe pain, bed rest may be helpful. Lay or sit in any position that is  comfortable.  Putting ice on the injured area.  Put ice in a bag.  Place a towel between your skin and the bag.  Leave the ice on for 15 to 20 minutes, 3 to 4 times a day.  Follow up with your caregiver for continued problems and no reason can be found for the pain. If the pain becomes worse or does not go away, it may be necessary to repeat tests or do additional testing. Your caregiver may need to look further for a possible cause. SEEK IMMEDIATE MEDICAL CARE IF:  You have pain that is getting worse and is not relieved by medications.  You develop chest pain that is associated with shortness or breath, sweating, feeling sick to your stomach (nauseous), or throw up (vomit).  Your pain becomes localized to the abdomen.  You develop any new symptoms that seem different or that concern you. MAKE SURE YOU:   Understand these instructions.  Will watch your condition.  Will get help right away if you are not doing well or get worse. Document Released: 04/20/2005 Document Revised: 07/13/2011 Document Reviewed: 12/23/2012 [facility] Patient Information 2014 [place], [place].

## 2013-07-15 NOTE — ED Provider Notes (Signed)
Medical screening examination/treatment/procedure(s) were performed by non-physician practitioner and as supervising physician I was immediately available for consultation/collaboration.     Odelle Kosier, MD 07/15/13 1252 

## 2013-10-26 ENCOUNTER — Encounter (HOSPITAL_COMMUNITY): Payer: Self-pay | Admitting: Emergency Medicine

## 2013-10-26 DIAGNOSIS — M25559 Pain in unspecified hip: Secondary | ICD-10-CM | POA: Insufficient documentation

## 2013-10-26 DIAGNOSIS — N12 Tubulo-interstitial nephritis, not specified as acute or chronic: Secondary | ICD-10-CM | POA: Insufficient documentation

## 2013-10-26 DIAGNOSIS — Z791 Long term (current) use of non-steroidal anti-inflammatories (NSAID): Secondary | ICD-10-CM | POA: Insufficient documentation

## 2013-10-26 DIAGNOSIS — Z87442 Personal history of urinary calculi: Secondary | ICD-10-CM | POA: Insufficient documentation

## 2013-10-26 DIAGNOSIS — Z3202 Encounter for pregnancy test, result negative: Secondary | ICD-10-CM | POA: Insufficient documentation

## 2013-10-26 DIAGNOSIS — F17200 Nicotine dependence, unspecified, uncomplicated: Secondary | ICD-10-CM | POA: Insufficient documentation

## 2013-10-26 DIAGNOSIS — Z87440 Personal history of urinary (tract) infections: Secondary | ICD-10-CM | POA: Insufficient documentation

## 2013-10-26 LAB — URINALYSIS, ROUTINE W REFLEX MICROSCOPIC
Bilirubin Urine: NEGATIVE
Glucose, UA: NEGATIVE mg/dL
KETONES UR: NEGATIVE mg/dL
Nitrite: POSITIVE — AB
PROTEIN: 30 mg/dL — AB
Specific Gravity, Urine: 1.024 (ref 1.005–1.030)
UROBILINOGEN UA: 0.2 mg/dL (ref 0.0–1.0)
pH: 5.5 (ref 5.0–8.0)

## 2013-10-26 LAB — CBC WITH DIFFERENTIAL/PLATELET
BASOS ABS: 0 10*3/uL (ref 0.0–0.1)
BASOS PCT: 0 % (ref 0–1)
EOS ABS: 0.1 10*3/uL (ref 0.0–0.7)
EOS PCT: 1 % (ref 0–5)
HCT: 33.8 % — ABNORMAL LOW (ref 36.0–46.0)
Hemoglobin: 12.2 g/dL (ref 12.0–15.0)
Lymphocytes Relative: 50 % — ABNORMAL HIGH (ref 12–46)
Lymphs Abs: 3.3 10*3/uL (ref 0.7–4.0)
MCH: 29.5 pg (ref 26.0–34.0)
MCHC: 36.1 g/dL — AB (ref 30.0–36.0)
MCV: 81.6 fL (ref 78.0–100.0)
MONO ABS: 0.6 10*3/uL (ref 0.1–1.0)
Monocytes Relative: 9 % (ref 3–12)
Neutro Abs: 2.7 10*3/uL (ref 1.7–7.7)
Neutrophils Relative %: 40 % — ABNORMAL LOW (ref 43–77)
PLATELETS: 242 10*3/uL (ref 150–400)
RBC: 4.14 MIL/uL (ref 3.87–5.11)
RDW: 14 % (ref 11.5–15.5)
WBC: 6.7 10*3/uL (ref 4.0–10.5)

## 2013-10-26 LAB — BASIC METABOLIC PANEL
BUN: 13 mg/dL (ref 6–23)
CALCIUM: 9.4 mg/dL (ref 8.4–10.5)
CO2: 20 mEq/L (ref 19–32)
CREATININE: 0.86 mg/dL (ref 0.50–1.10)
Chloride: 102 mEq/L (ref 96–112)
GFR calc Af Amer: >90 mL/min (ref >90)
GFR, EST NON AFRICAN AMERICAN: 82 mL/min — AB (ref >90)
Glucose, Bld: 90 mg/dL (ref 70–99)
Potassium: 3.3 mEq/L — ABNORMAL LOW (ref 3.7–5.3)
Sodium: 139 mEq/L (ref 137–147)

## 2013-10-26 LAB — URINE MICROSCOPIC-ADD ON

## 2013-10-26 LAB — PREGNANCY, URINE: PREG TEST UR: NEGATIVE

## 2013-10-26 MED ORDER — OXYCODONE-ACETAMINOPHEN 5-325 MG PO TABS
1.0000 | ORAL_TABLET | Freq: Once | ORAL | Status: AC
  Administered 2013-10-26: 1 via ORAL
  Filled 2013-10-26: qty 1

## 2013-10-26 NOTE — ED Notes (Signed)
Patient states for about 4-5 days she has been having pain in her kidneys, left lower back pain that travels down her left leg, has been losing a lot of weight lately

## 2013-10-27 ENCOUNTER — Emergency Department (HOSPITAL_COMMUNITY): Payer: Medicaid Other

## 2013-10-27 ENCOUNTER — Emergency Department (HOSPITAL_COMMUNITY)
Admission: EM | Admit: 2013-10-27 | Discharge: 2013-10-27 | Disposition: A | Discharge status: Home / Self Care | Payer: Medicaid Other | Attending: Emergency Medicine | Admitting: Emergency Medicine

## 2013-10-27 DIAGNOSIS — N12 Tubulo-interstitial nephritis, not specified as acute or chronic: Secondary | ICD-10-CM

## 2013-10-27 MED ORDER — DEXTROSE 5 % IV SOLN
1.0000 g | Freq: Once | INTRAVENOUS | Status: AC
  Administered 2013-10-27: 1 g via INTRAVENOUS
  Filled 2013-10-27: qty 10

## 2013-10-27 MED ORDER — OXYCODONE-ACETAMINOPHEN 5-325 MG PO TABS
1.0000 | ORAL_TABLET | Freq: Once | ORAL | Status: AC
  Administered 2013-10-27: 1 via ORAL
  Filled 2013-10-27: qty 1

## 2013-10-27 MED ORDER — OXYCODONE-ACETAMINOPHEN 5-325 MG PO TABS: 1.0000 | ORAL_TABLET | Freq: Four times a day (QID) | ORAL | Status: AC | PRN

## 2013-10-27 MED ORDER — CEPHALEXIN 500 MG PO CAPS: 500.0000 mg | ORAL_CAPSULE | Freq: Four times a day (QID) | ORAL | Status: AC

## 2013-10-27 NOTE — Discharge Instructions (Signed)
Return to the ED with any concerns including fever/chills, vomiting and not able to keep down liquids or antibiotics, weakness of legs, not able to urinate, loss of control of bowel or bladder, decreased level of alertness/lethargy, or any other alarming symptoms

## 2013-10-27 NOTE — ED Notes (Signed)
Pt reports rt flank pain that pt states she has been experiencing "for a while" pt reports worsening pain in the past few days. Denies vomiting, diarrhea, fever or chills. Admits she has a hx of kidney infections in the past. Pt reports dark urine recently and some nausea. Pt also mentions that she has been experiencing radiating pain down left leg, family at bedside concerned pt's toes may appear to be swollen. Pt denies any mechanism of injury, extremity w/ CMS intact.

## 2013-10-27 NOTE — ED Notes (Signed)
Pt ambulating independently w/ steady gait on d/c in no acute distress, A&Ox4. D/c instructions reviewed w/ pt and family - pt and family deny any further questions or concerns at present. Rx given x2  

## 2013-10-27 NOTE — ED Provider Notes (Signed)
CSN: [id number]     Arrival date & time 10/26/13  2153 History   First MD Initiated Contact with Patient 10/27/13 0140     Chief Complaint  Patient presents with  . Multiple Complaints      (Consider location/radiation/quality/duration/timing/severity/associated sxs/prior Treatment) HPI Pt with hx of UTI in the past presents with c/o left sided flank pain.  States symptoms have been ongoing for the past 2 weeks, worse over the past 4 days.  No fever/chills.  No vomiting.  She also states she has pain in left hip which is worse with movement and with walking- no known injury.  She feels this is different from her flank pain.  No dysuria, feels her urine is darker than usual.  No urgency or frequency.  She denies being on abx recently.  No weakness of legs, no incontinence of bowel or bladder, no urinary retention.  There are no other associated systemic symptoms, there are no other alleviating or modifying factors.   Past Medical History  Diagnosis Date  . Pyelonephritis 11/24/2012  . Kidney stones    History reviewed. No pertinent past surgical history. History reviewed. No pertinent family history. History  Substance Use Topics  . Smoking status: Light Tobacco Smoker    Types: Cigarettes  . Smokeless tobacco: Never Used     Comment: 4-5 cigarettes per day  . Alcohol Use: No   OB History   Grav Para Term Preterm Abortions TAB SAB Ect Mult Living                 Review of Systems ROS reviewed and all otherwise negative except for mentioned in HPI    Allergies  Review of patient's allergies indicates no known allergies.  Home Medications   Prior to Admission medications   Medication Sig Start Date End Date Taking? Authorizing Provider  cephALEXin (KEFLEX) 500 MG capsule Take 1 capsule (500 mg total) by mouth 4 (four) times daily. 10/27/13   Ethelda ChickMartha K Linker, MD  diazepam (VALIUM) 5 MG tablet Take 1 tablet (5 mg total) by mouth every 6 (six) hours as needed for anxiety  (spasms). 05/29/13   Phill MutterPeter S Dammen, PA-C  ibuprofen (ADVIL,MOTRIN) 800 MG tablet Take 1 tablet (800 mg total) by mouth 3 (three) times daily. 05/29/13   Phill MutterPeter S Dammen, PA-C  methocarbamol (ROBAXIN) 500 MG tablet Take 1 tablet (500 mg total) by mouth every 6 (six) hours as needed for muscle spasms. 07/15/13   Trixie DredgeEmily West, PA-C  oxyCODONE-acetaminophen (PERCOCET/ROXICET) 5-325 MG per tablet Take 1-2 tablets by mouth every 6 (six) hours as needed for severe pain. 10/27/13   Ethelda ChickMartha K Linker, MD   BP 95/57  Pulse 62  Temp(Src) 98.6 F (37 C) (Oral)  Resp 16  Ht 5\' 6"  (1.676 m)  Wt 145 lb 8 oz (65.998 kg)  BMI 23.50 kg/m2  SpO2 100%  LMP 10/18/2013 Vitals reviewed Physical Exam Physical Examination: General appearance - alert, well appearing, and in no distress Mental status - alert, oriented to person, place, and time Mouth - mucous membranes moist, pharynx normal without lesions Neck - no midline tenderness to palpation Chest - clear to auscultation, no wheezes, rales or rhonchi, symmetric air entry Heart - normal rate, regular rhythm, normal S1, S2, no murmurs, rubs, clicks or gallops Abdomen - soft, nontender, nondistended, no masses or organomegaly Back exam - full range of motion, no midline tenderness to palpation, right CVA tenderness Neurological - alert, oriented x 3, normal speech, strength 5/5  in extremities x 4, sensation intact Musculoskeletal - no joint tenderness, deformity or swelling Extremities - peripheral pulses normal, no pedal edema, no clubbing or cyanosis Skin - normal coloration and turgor, no rashes  ED Course  Procedures (including critical care time) Labs Review Labs Reviewed  CBC WITH DIFFERENTIAL - Abnormal; Notable for the following:    HCT 33.8 (*)    MCHC 36.1 (*)    Neutrophils Relative % 40 (*)    Lymphocytes Relative 50 (*)    All other components within normal limits  BASIC METABOLIC PANEL - Abnormal; Notable for the following:    Potassium 3.3  (*)    GFR calc non Af Amer 82 (*)    All other components within normal limits  URINALYSIS, ROUTINE W REFLEX MICROSCOPIC - Abnormal; Notable for the following:    APPearance CLOUDY (*)    Hgb urine dipstick LARGE (*)    Protein, ur 30 (*)    Nitrite POSITIVE (*)    Leukocytes, UA MODERATE (*)    All other components within normal limits  URINE MICROSCOPIC-ADD ON - Abnormal; Notable for the following:    Squamous Epithelial / LPF FEW (*)    Bacteria, UA MANY (*)    All other components within normal limits  PREGNANCY, URINE    Imaging Review No results found.   EKG Interpretation None      MDM   Final diagnoses:  Pyelonephritis   Pt presenting with right sided flank pain as well as right hip pain. UA c/w UTI and patient has some CVA tendenress. Pt started on IV abx in the ED as well as IV hydration.  Xray of hip is reassuring.  Discharged with strict return precautions.  Pt agreeable with plan.  Nursing notes including past medical history and social history reviewed and considered in documentation  Prior records reviewed and considered during this visit      Ethelda ChickMartha K Linker, MD 10/31/13 1510

## 2013-10-27 NOTE — ED Notes (Signed)
Patient transported to X-ray 

## 2014-07-25 IMAGING — US US RENAL
1 series · 14 of 25 positions shown · non-contrast
Comparison: None.

CLINICAL DATA: Fatigue, chills, pain.  Acute pyelonephritis.

RENAL/URINARY TRACT ULTRASOUND COMPLETE

[Series 1: us renal · 0.28mm/px · 14 of 46 slices shown]
[im 1/46]
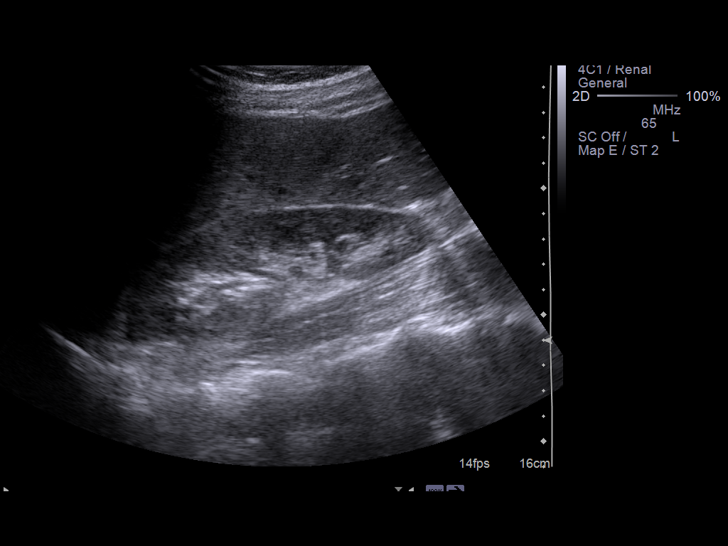
[im 4/46]
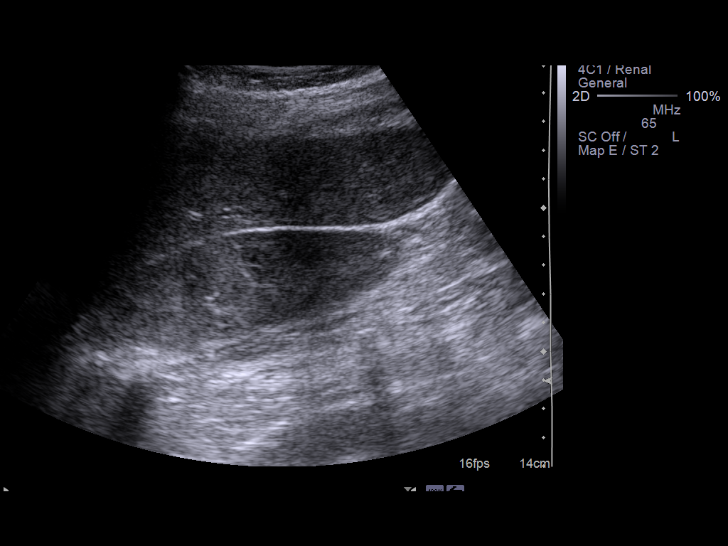
[im 8/46]
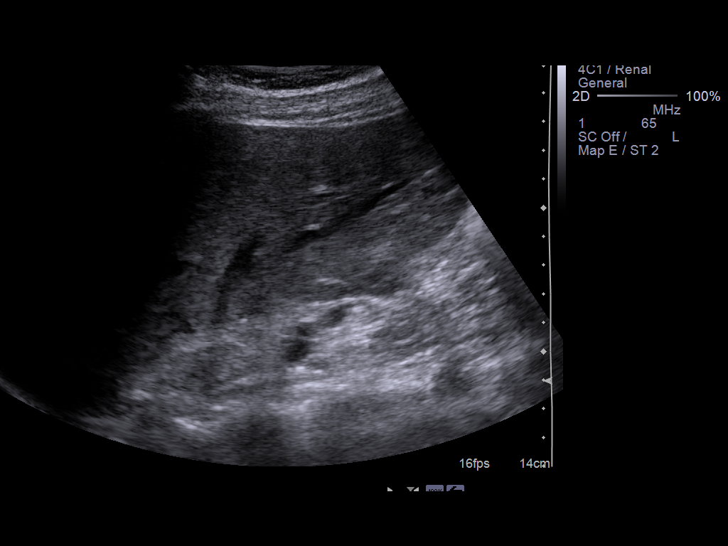
[im 12/46]
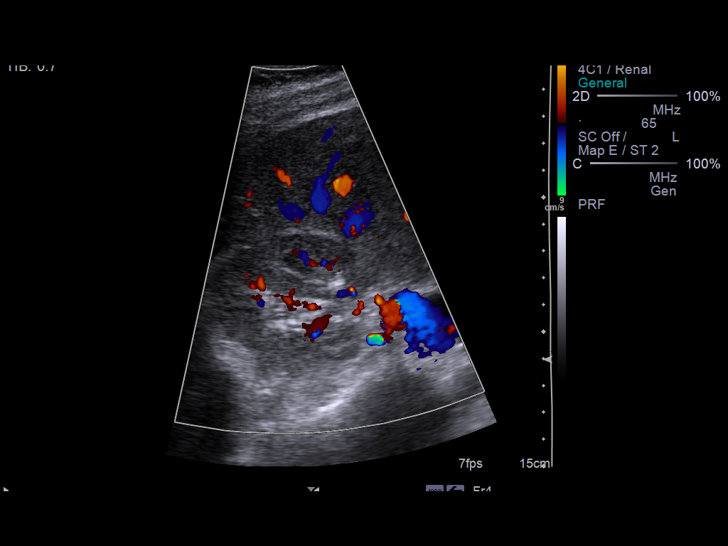
[im 16/46]
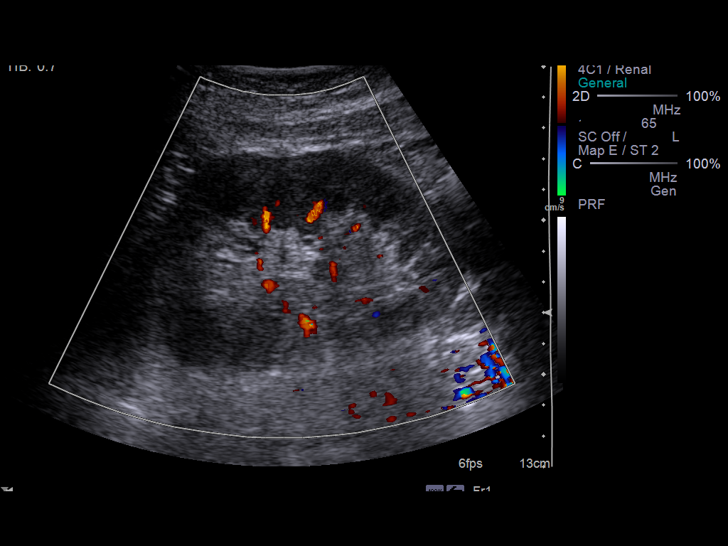
[im 17/46]
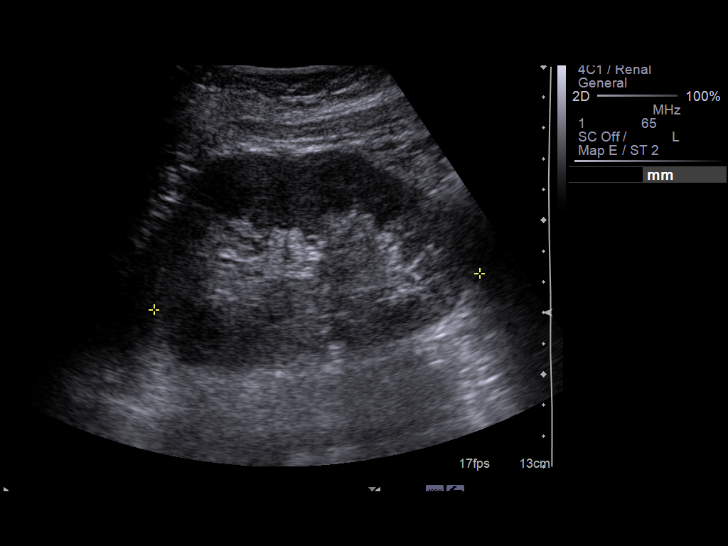
[im 21/46]
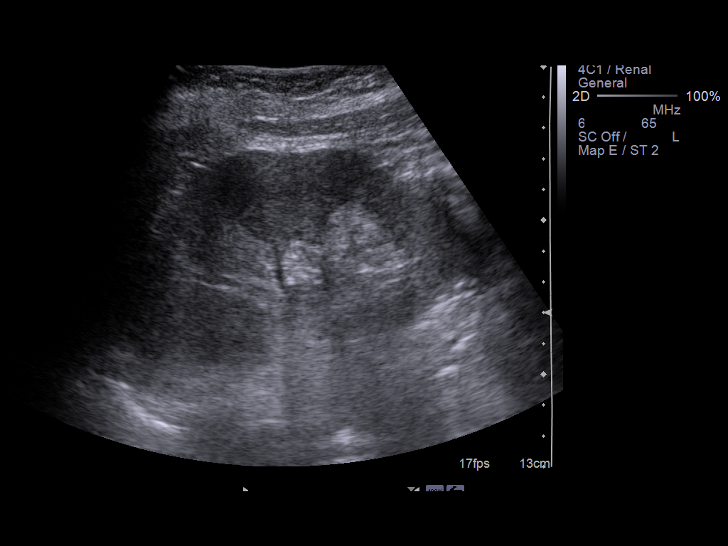
[im 25/46]
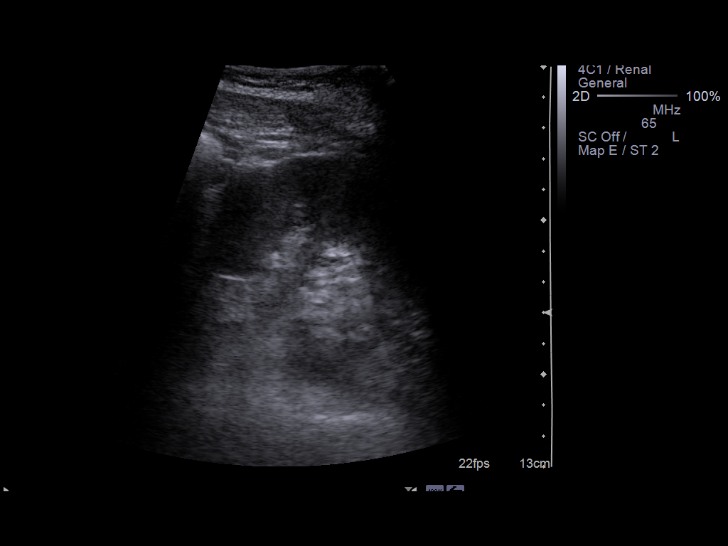
[im 29/46]
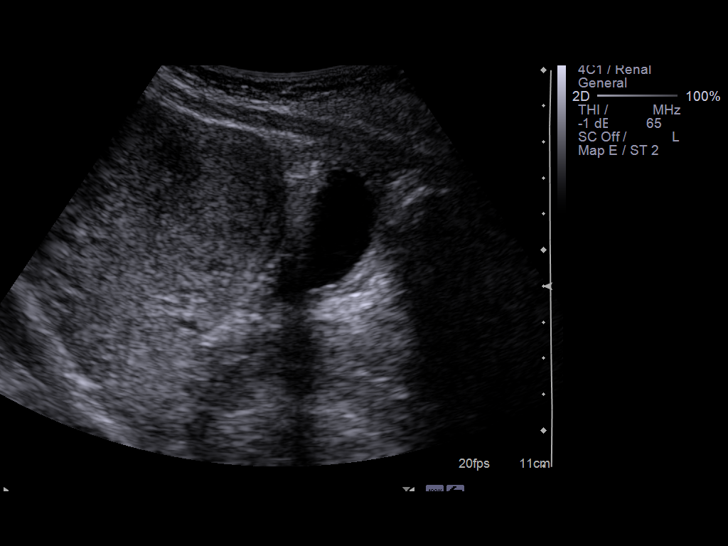
[im 31/46]
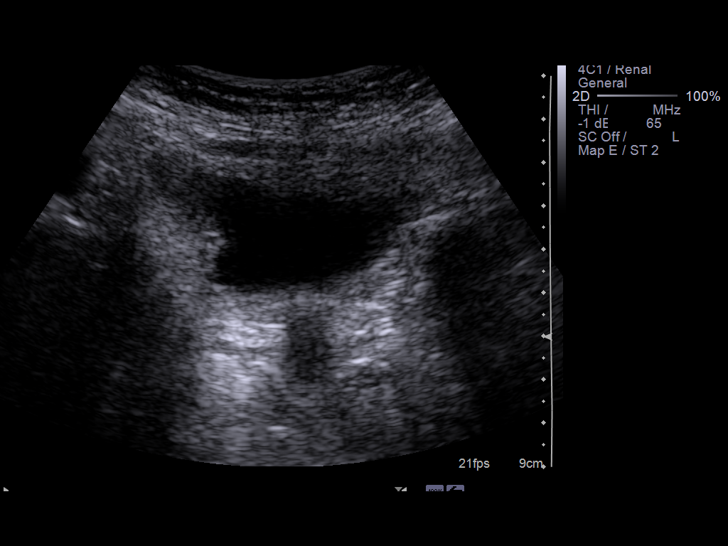
[im 34/46]
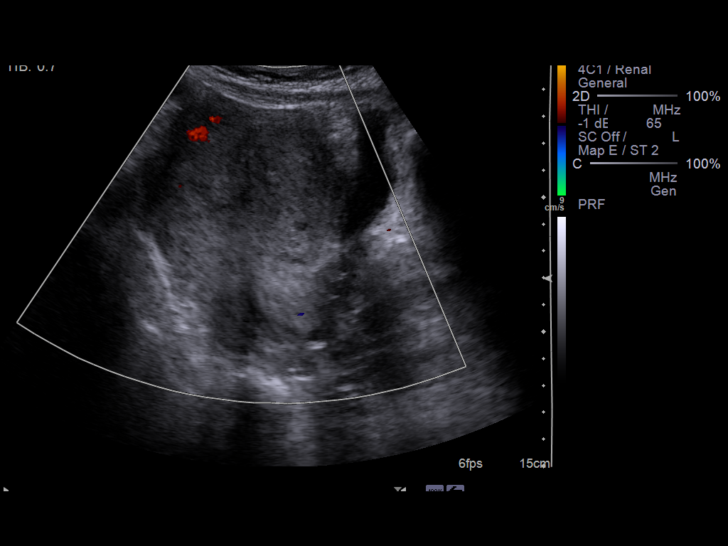
[im 38/46]
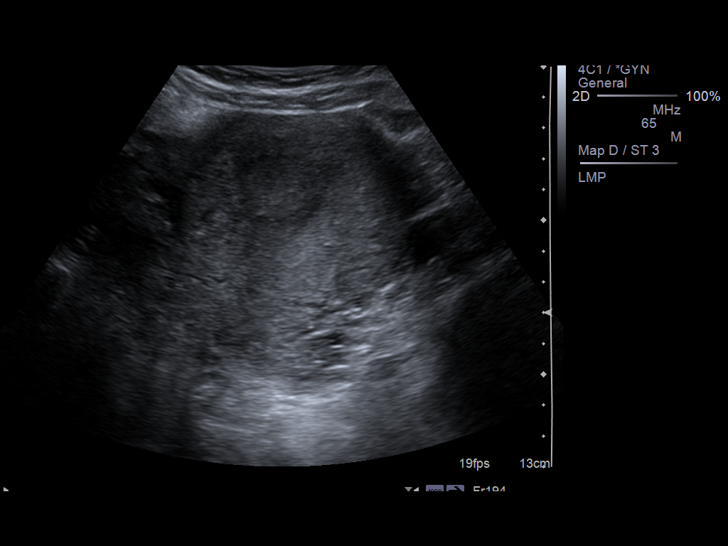
[im 42/46]
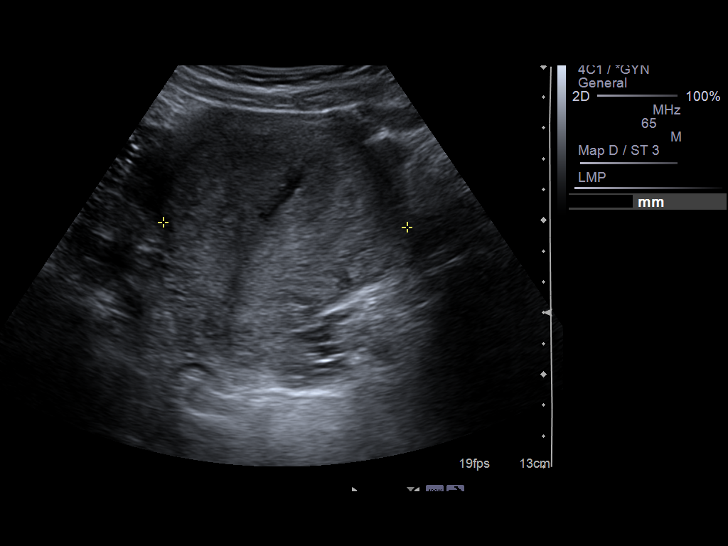
[im 46/46]
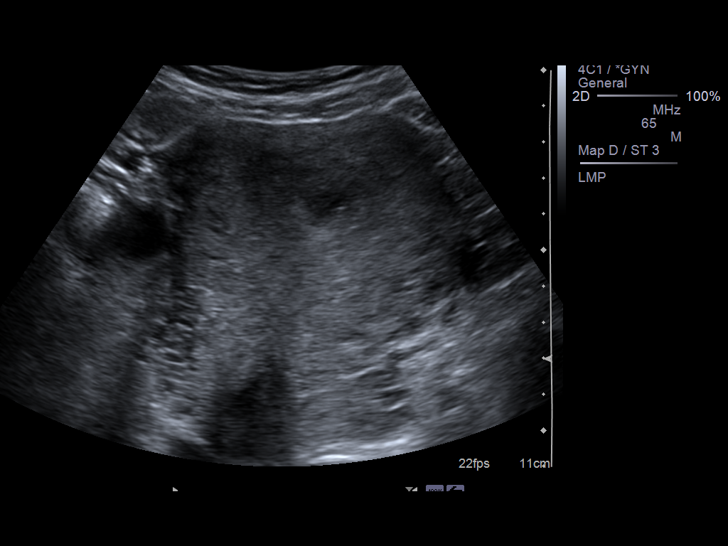

[14 of 25 positions shown; findings below may reference images not displayed]

FINDINGS: Right Kidney:  The right kidney measures 12.6 cm length.  Normal
parenchymal echotexture and thickness.  No hydronephrosis.  No
solid mass.  No perinephric fluid collections.

Left Kidney:  The left kidney measures 10.6 cm length.  Normal
parenchymal echotexture and thickness.  No hydronephrosis.  No
solid mass.  No perinephric fluid collections.

Bladder:  The bladder is incompletely distended and is therefore
incompletely evaluated but there is no specific evidence of bladder
wall thickening or filling defect.

Incidental note of moderately enlarged heterogeneous appearing
uterus measuring 9.7 x 7.2-7.9 cm.  Small amount of fluid in the
endometrium with otherwise normal endometrial stripe thickness.
IMPRESSION: Normal ultrasound appearance of the kidneys and bladder.  The
uterus is incidentally noted to be mildly enlarged and
heterogeneous appearing with small amount of fluid in the normal
thickness endometrium.

## 2014-10-21 IMAGING — CT CT ABD-PELV W/ CM
2 of 5 series · 16 of 46 positions shown, 18 images · IV contrast (APPLIED)
Comparison: None.

CLINICAL DATA: Severe back and joint pain.

EXAM:
CT ABDOMEN AND PELVIS WITH CONTRAST
TECHNIQUE: Multidetector CT imaging of the abdomen and pelvis was performed
using the standard protocol following bolus administration of
intravenous contrast.
CONTRAST:  100mL OMNIPAQUE IOHEXOL 300 MG/ML  SOLN

[Series 2: abd/ pelvis 5.0 i30f 1 · axial · 0.67mm/px · z∈[+911,+1291]mm · 13 of 84 slices shown, 15 images]
[im 4/84  soft-tissue]
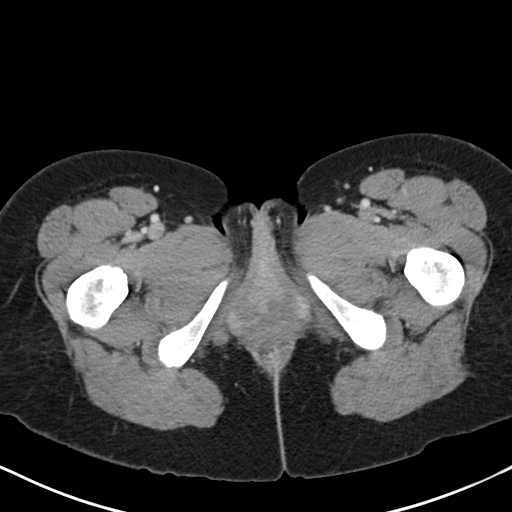
[im 4/84  bone]
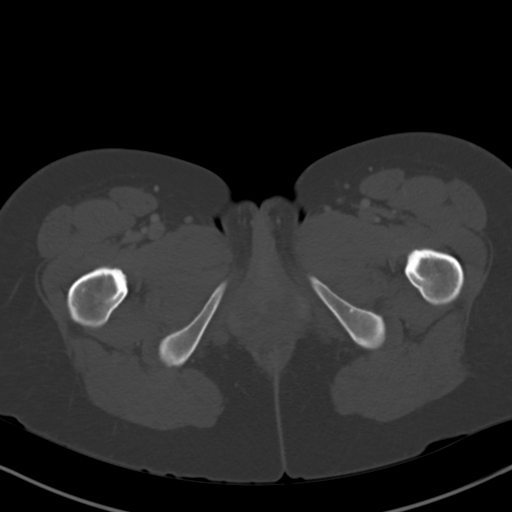
[im 12/84  soft-tissue]
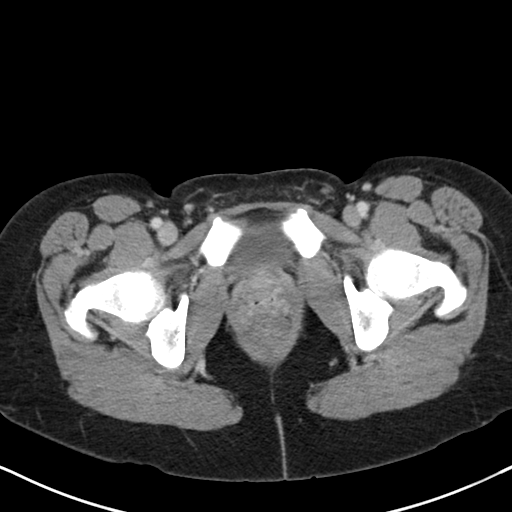
[im 16/84  soft-tissue]
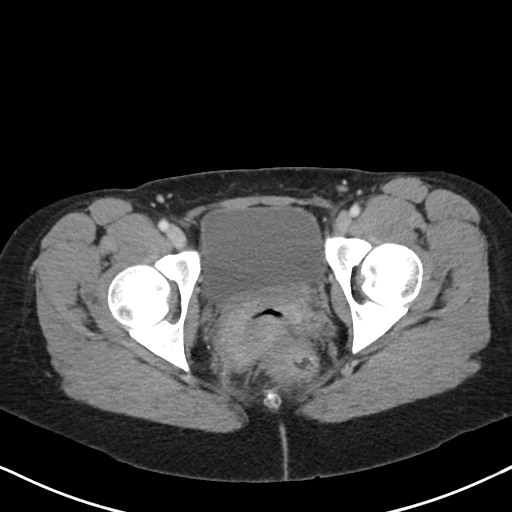
[im 24/84  soft-tissue]
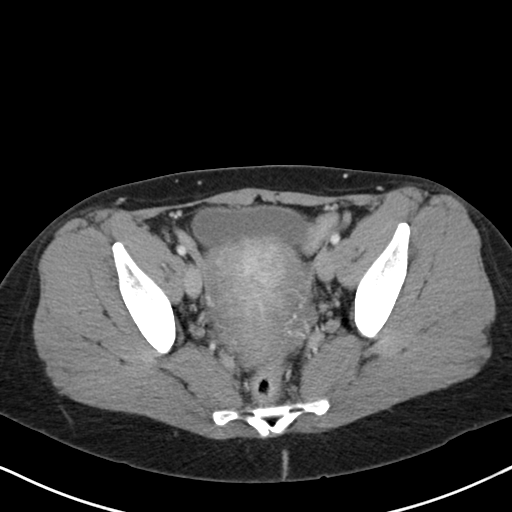
[im 28/84  soft-tissue]
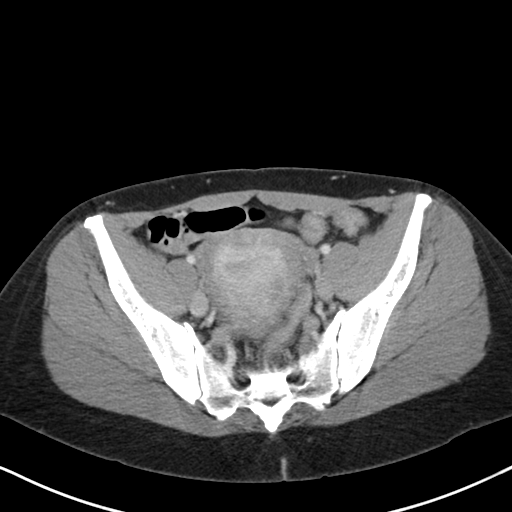
[im 36/84  soft-tissue]
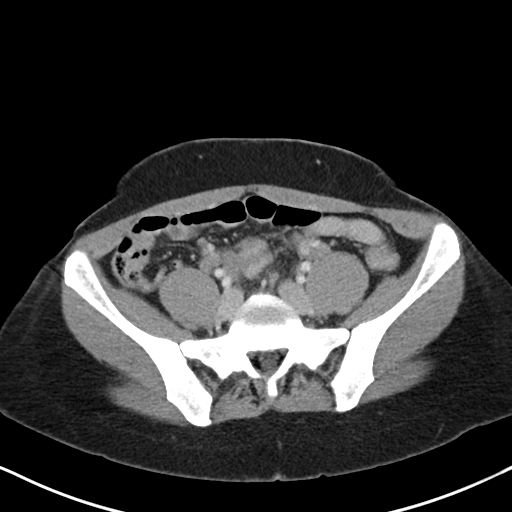
[im 44/84  soft-tissue]
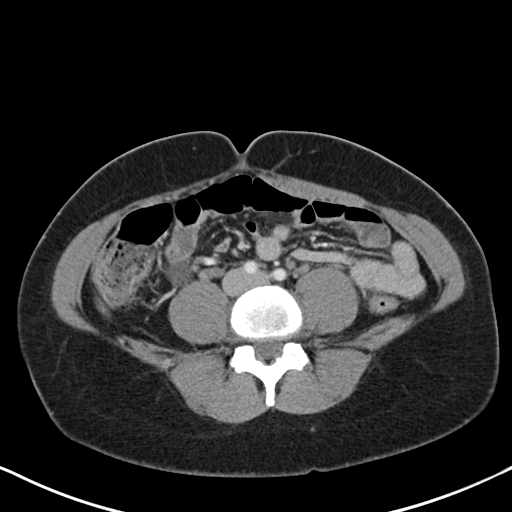
[im 48/84  soft-tissue]
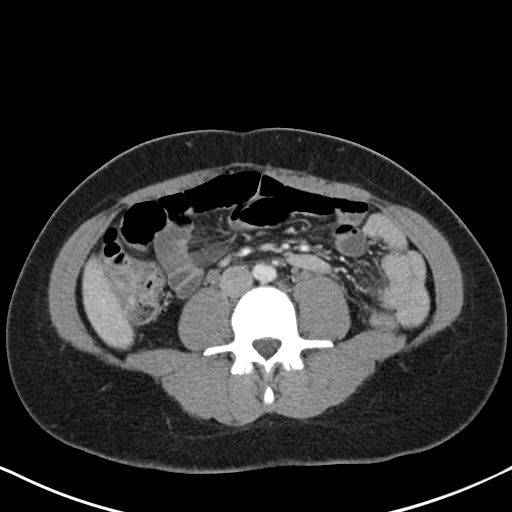
[im 56/84  soft-tissue]
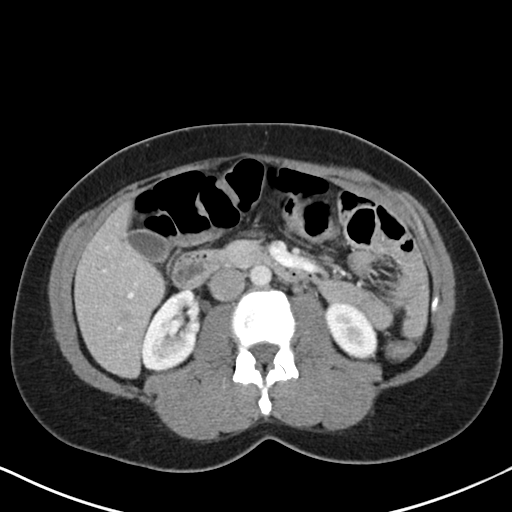
[im 56/84  bone]
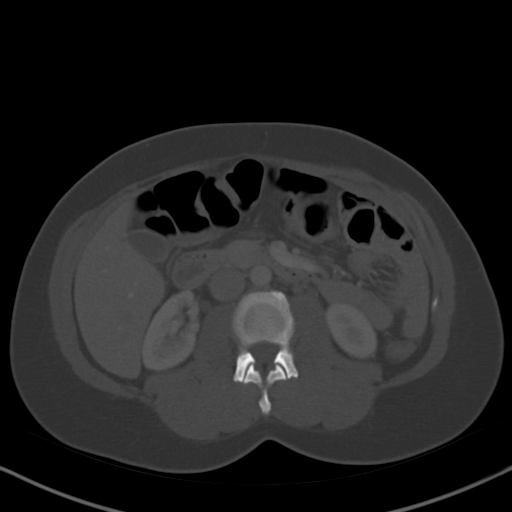
[im 60/84  soft-tissue]
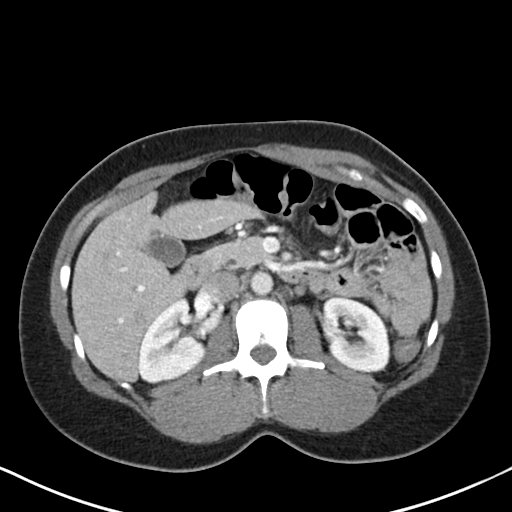
[im 68/84  soft-tissue]
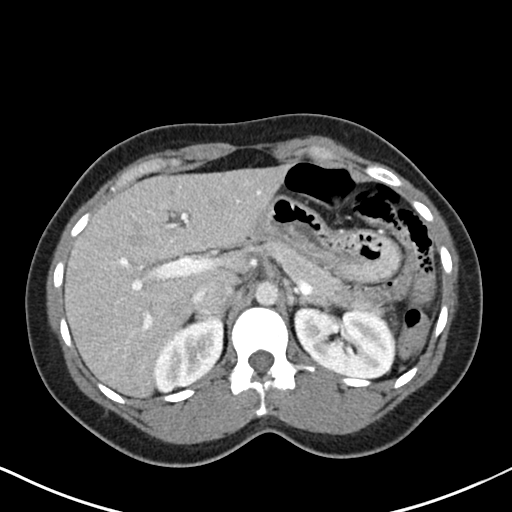
[im 72/84  soft-tissue]
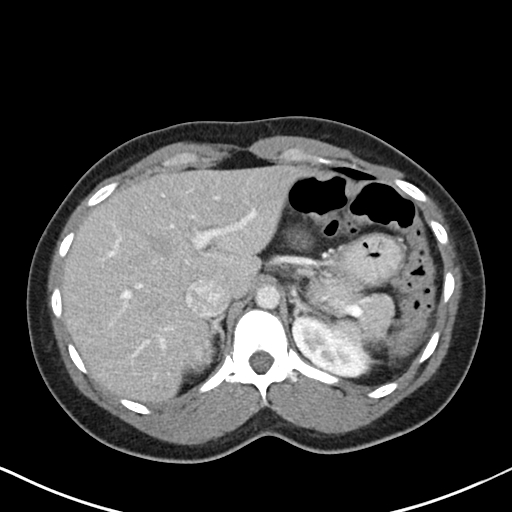
[im 80/84  soft-tissue]
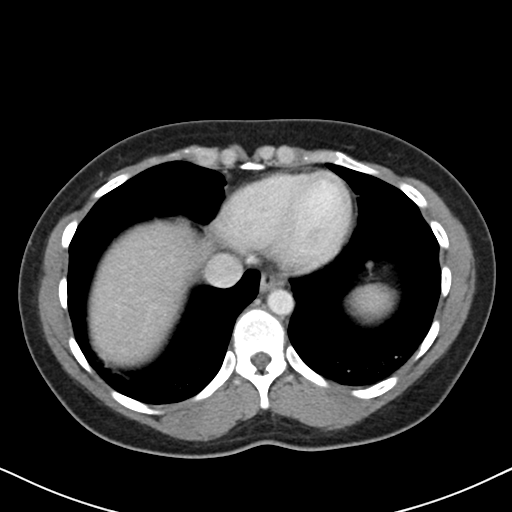

[Series 5: cororal soft tissue · coronal · 0.67mm/px · 3 of 69 slices shown]
[im 23/69  soft-tissue]
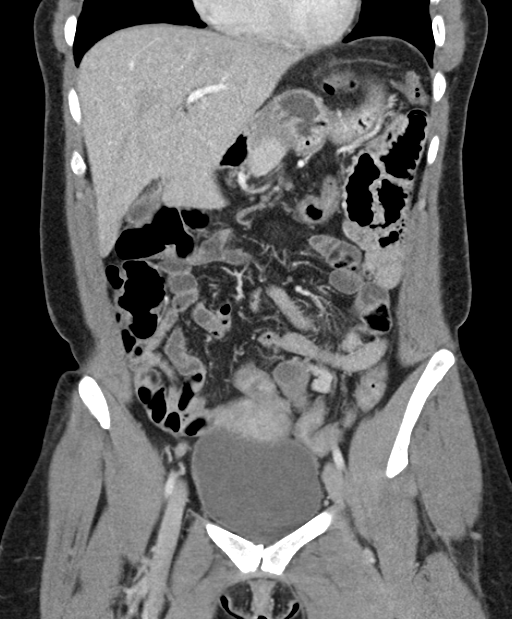
[im 31/69  soft-tissue]
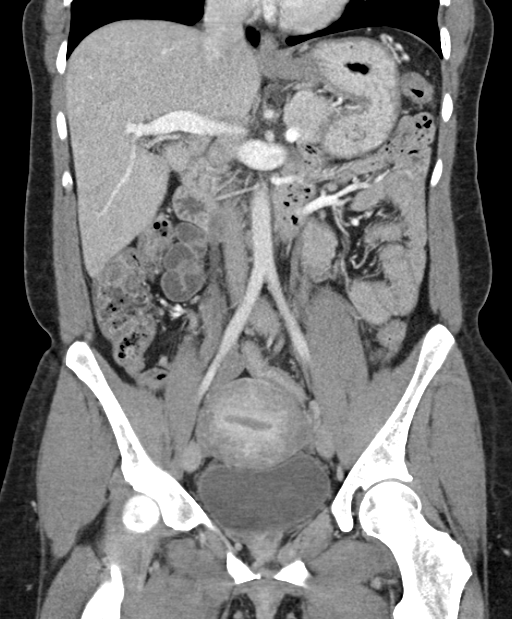
[im 38/69  soft-tissue]
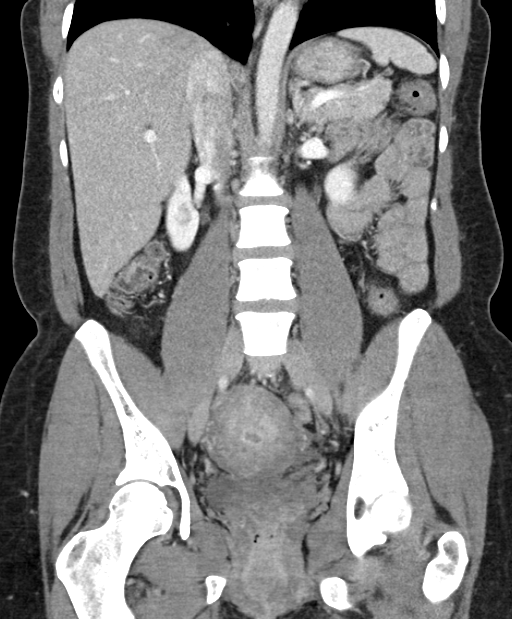

[16 of 46 positions shown; findings below may reference images not displayed]

FINDINGS: Images which include the lung bases show compressive atelectasis in
both dependent lower lobes. No focal abnormality in the liver or
spleen. The stomach, duodenum, pancreas, gallbladder, adrenal
glands, and right kidney have normal imaging features. Tiny
low-density lesion in the left kidney is too small to characterize
but is likely a cyst.

No abdominal aortic aneurysm. No free fluid or lymphadenopathy.
Abdominal bowel loops are unremarkable. Major arterial and venous
anatomy scratched that major mesenteric arterial anatomy is patent.
The portal vein and superior mesenteric vein are patent.

Imaging through the pelvis shows no substantial intraperitoneal free
fluid. No pelvic sidewall lymphadenopathy. Bladder is normal. Uterus
is unremarkable. 19 mm dominant follicle seen in the left ovary.
Terminal ileum is normal. The appendix is normal.

Bone windows reveal no worrisome lytic or sclerotic osseous lesions.
IMPRESSION: No acute findings in the abdomen or pelvis. Specifically, no
findings to explain the patient's history of pain.
